# Patient Record
Sex: Male | Born: 1990 | Race: White | Hispanic: No | Marital: Single | Smoking: Current every day smoker
Health system: Southern US, Community
[De-identification: ages and names within clinical notes are randomized; demographics above are authoritative.]

## PROBLEM LIST (undated history)

## (undated) DIAGNOSIS — I10 Essential (primary) hypertension: Secondary | ICD-10-CM

---

## 2020-07-14 DIAGNOSIS — R109 Unspecified abdominal pain: Secondary | ICD-10-CM | POA: Insufficient documentation

## 2020-07-14 DIAGNOSIS — R1110 Vomiting, unspecified: Secondary | ICD-10-CM | POA: Insufficient documentation

## 2020-07-14 DIAGNOSIS — Z5321 Procedure and treatment not carried out due to patient leaving prior to being seen by health care provider: Secondary | ICD-10-CM | POA: Insufficient documentation

## 2020-07-15 ENCOUNTER — Encounter: Payer: Self-pay | Admitting: Emergency Medicine

## 2020-07-15 ENCOUNTER — Inpatient Hospital Stay
Admission: EM | Admit: 2020-07-15 | Discharge: 2020-07-19 | DRG: 638 | Disposition: A | Discharge status: Home / Self Care | Payer: Self-pay | Attending: Internal Medicine | Admitting: Internal Medicine

## 2020-07-15 ENCOUNTER — Other Ambulatory Visit: Payer: Self-pay

## 2020-07-15 ENCOUNTER — Emergency Department: Payer: Self-pay

## 2020-07-15 ENCOUNTER — Emergency Department
Admission: EM | Admit: 2020-07-15 | Discharge: 2020-07-15 | Disposition: A | Discharge status: Home / Self Care | Payer: Self-pay | Attending: Emergency Medicine | Admitting: Emergency Medicine

## 2020-07-15 DIAGNOSIS — Z20822 Contact with and (suspected) exposure to covid-19: Secondary | ICD-10-CM | POA: Diagnosis present

## 2020-07-15 DIAGNOSIS — N179 Acute kidney failure, unspecified: Secondary | ICD-10-CM | POA: Diagnosis present

## 2020-07-15 DIAGNOSIS — R030 Elevated blood-pressure reading, without diagnosis of hypertension: Secondary | ICD-10-CM | POA: Diagnosis present

## 2020-07-15 DIAGNOSIS — Z833 Family history of diabetes mellitus: Secondary | ICD-10-CM

## 2020-07-15 DIAGNOSIS — E861 Hypovolemia: Secondary | ICD-10-CM | POA: Diagnosis present

## 2020-07-15 DIAGNOSIS — R748 Abnormal levels of other serum enzymes: Secondary | ICD-10-CM | POA: Diagnosis present

## 2020-07-15 DIAGNOSIS — Z6841 Body Mass Index (BMI) 40.0 and over, adult: Secondary | ICD-10-CM

## 2020-07-15 DIAGNOSIS — E1110 Type 2 diabetes mellitus with ketoacidosis without coma: Principal | ICD-10-CM | POA: Diagnosis present

## 2020-07-15 DIAGNOSIS — E876 Hypokalemia: Secondary | ICD-10-CM | POA: Diagnosis present

## 2020-07-15 DIAGNOSIS — E1310 Other specified diabetes mellitus with ketoacidosis without coma: Secondary | ICD-10-CM

## 2020-07-15 DIAGNOSIS — F17210 Nicotine dependence, cigarettes, uncomplicated: Secondary | ICD-10-CM | POA: Diagnosis present

## 2020-07-15 DIAGNOSIS — E872 Acidosis, unspecified: Secondary | ICD-10-CM

## 2020-07-15 DIAGNOSIS — E860 Dehydration: Secondary | ICD-10-CM | POA: Diagnosis present

## 2020-07-15 DIAGNOSIS — D649 Anemia, unspecified: Secondary | ICD-10-CM | POA: Diagnosis present

## 2020-07-15 DIAGNOSIS — E119 Type 2 diabetes mellitus without complications: Secondary | ICD-10-CM

## 2020-07-15 HISTORY — DX: Essential (primary) hypertension: I10

## 2020-07-15 LAB — BLOOD GAS, VENOUS
Acid-base deficit: 24.8 mmol/L — ABNORMAL HIGH (ref 0.0–2.0)
Bicarbonate: 6.3 mmol/L — ABNORMAL LOW (ref 20.0–28.0)
O2 Saturation: 41.7 %
Patient temperature: 37
pCO2, Ven: 28 mmHg — ABNORMAL LOW (ref 44.0–60.0)
pH, Ven: 6.96 — CL (ref 7.250–7.430)
pO2, Ven: 40 mmHg (ref 32.0–45.0)

## 2020-07-15 LAB — COMPREHENSIVE METABOLIC PANEL
ALT: 21 U/L (ref 0–44)
ALT: 19 U/L (ref 0–44)
AST: 22 U/L (ref 15–41)
AST: 32 U/L (ref 15–41)
Albumin: 5.3 g/dL — ABNORMAL HIGH (ref 3.5–5.0)
Albumin: 5.2 g/dL — ABNORMAL HIGH (ref 3.5–5.0)
Alkaline Phosphatase: 62 U/L (ref 38–126)
Alkaline Phosphatase: 67 U/L (ref 38–126)
BUN: 20 mg/dL (ref 6–20)
BUN: 31 mg/dL — ABNORMAL HIGH (ref 6–20)
CO2: <7 mmol/L — ABNORMAL LOW (ref 22–32)
CO2: <7 mmol/L — ABNORMAL LOW (ref 22–32)
Calcium: 9.7 mg/dL (ref 8.9–10.3)
Calcium: 9.7 mg/dL (ref 8.9–10.3)
Chloride: 94 mmol/L — ABNORMAL LOW (ref 98–111)
Chloride: 90 mmol/L — ABNORMAL LOW (ref 98–111)
Creatinine, Ser: 1.97 mg/dL — ABNORMAL HIGH (ref 0.61–1.24)
Creatinine, Ser: 2.95 mg/dL — ABNORMAL HIGH (ref 0.61–1.24)
GFR, Estimated: 46 mL/min — ABNORMAL LOW (ref >60)
GFR, Estimated: 29 mL/min — ABNORMAL LOW (ref >60)
Glucose, Bld: 494 mg/dL — ABNORMAL HIGH (ref 70–99)
Glucose, Bld: 793 mg/dL (ref 70–99)
Potassium: 5.4 mmol/L — ABNORMAL HIGH (ref 3.5–5.1)
Potassium: 5.7 mmol/L — ABNORMAL HIGH (ref 3.5–5.1)
Sodium: 129 mmol/L — ABNORMAL LOW (ref 135–145)
Sodium: 131 mmol/L — ABNORMAL LOW (ref 135–145)
Total Bilirubin: 1.9 mg/dL — ABNORMAL HIGH (ref 0.3–1.2)
Total Bilirubin: 1.9 mg/dL — ABNORMAL HIGH (ref 0.3–1.2)
Total Protein: 9.4 g/dL — ABNORMAL HIGH (ref 6.5–8.1)
Total Protein: 9.3 g/dL — ABNORMAL HIGH (ref 6.5–8.1)

## 2020-07-15 LAB — CBC
HCT: 52 % (ref 39.0–52.0)
HCT: 52.2 % — ABNORMAL HIGH (ref 39.0–52.0)
Hemoglobin: 17.5 g/dL — ABNORMAL HIGH (ref 13.0–17.0)
Hemoglobin: 17.3 g/dL — ABNORMAL HIGH (ref 13.0–17.0)
MCH: 31.3 pg (ref 26.0–34.0)
MCH: 31.3 pg (ref 26.0–34.0)
MCHC: 33.7 g/dL (ref 30.0–36.0)
MCHC: 33.1 g/dL (ref 30.0–36.0)
MCV: 92.9 fL (ref 80.0–100.0)
MCV: 94.4 fL (ref 80.0–100.0)
Platelets: 261 10*3/uL (ref 150–400)
Platelets: 262 10*3/uL (ref 150–400)
RBC: 5.6 MIL/uL (ref 4.22–5.81)
RBC: 5.53 MIL/uL (ref 4.22–5.81)
RDW: 12.3 % (ref 11.5–15.5)
RDW: 12.6 % (ref 11.5–15.5)
WBC: 10.6 10*3/uL — ABNORMAL HIGH (ref 4.0–10.5)
WBC: 23.3 10*3/uL — ABNORMAL HIGH (ref 4.0–10.5)
nRBC: 0 % (ref 0.0–0.2)
nRBC: 0 % (ref 0.0–0.2)

## 2020-07-15 LAB — BASIC METABOLIC PANEL
Anion gap: 29 — ABNORMAL HIGH (ref 5–15)
Anion gap: 21 — ABNORMAL HIGH (ref 5–15)
BUN: 29 mg/dL — ABNORMAL HIGH (ref 6–20)
BUN: 22 mg/dL — ABNORMAL HIGH (ref 6–20)
CO2: 7 mmol/L — ABNORMAL LOW (ref 22–32)
CO2: 13 mmol/L — ABNORMAL LOW (ref 22–32)
Calcium: 9 mg/dL (ref 8.9–10.3)
Calcium: 9 mg/dL (ref 8.9–10.3)
Chloride: 99 mmol/L (ref 98–111)
Chloride: 100 mmol/L (ref 98–111)
Creatinine, Ser: 2.12 mg/dL — ABNORMAL HIGH (ref 0.61–1.24)
Creatinine, Ser: 1.41 mg/dL — ABNORMAL HIGH (ref 0.61–1.24)
GFR, Estimated: 42 mL/min — ABNORMAL LOW (ref >60)
GFR, Estimated: >60 mL/min (ref >60)
Glucose, Bld: 390 mg/dL — ABNORMAL HIGH (ref 70–99)
Glucose, Bld: 227 mg/dL — ABNORMAL HIGH (ref 70–99)
Potassium: 4.5 mmol/L (ref 3.5–5.1)
Potassium: 3.6 mmol/L (ref 3.5–5.1)
Sodium: 135 mmol/L (ref 135–145)
Sodium: 134 mmol/L — ABNORMAL LOW (ref 135–145)

## 2020-07-15 LAB — URINALYSIS, ROUTINE W REFLEX MICROSCOPIC
Bilirubin Urine: NEGATIVE
Glucose, UA: >=500 mg/dL — AB
Ketones, ur: 80 mg/dL — AB
Leukocytes,Ua: NEGATIVE
Nitrite: NEGATIVE
Protein, ur: 30 mg/dL — AB
Specific Gravity, Urine: 1.022 (ref 1.005–1.030)
pH: 5 (ref 5.0–8.0)

## 2020-07-15 LAB — URINALYSIS, COMPLETE (UACMP) WITH MICROSCOPIC
Bacteria, UA: NONE SEEN
Bilirubin Urine: NEGATIVE
Glucose, UA: >=500 mg/dL — AB
Ketones, ur: 80 mg/dL — AB
Leukocytes,Ua: NEGATIVE
Nitrite: NEGATIVE
Protein, ur: 100 mg/dL — AB
Specific Gravity, Urine: 1.025 (ref 1.005–1.030)
pH: 5 (ref 5.0–8.0)

## 2020-07-15 LAB — CBG MONITORING, ED
Glucose-Capillary: 584 mg/dL (ref 70–99)
Glucose-Capillary: >600 mg/dL (ref 70–99)
Glucose-Capillary: 468 mg/dL — ABNORMAL HIGH (ref 70–99)
Glucose-Capillary: 557 mg/dL (ref 70–99)
Glucose-Capillary: 401 mg/dL — ABNORMAL HIGH (ref 70–99)
Glucose-Capillary: 312 mg/dL — ABNORMAL HIGH (ref 70–99)
Glucose-Capillary: 223 mg/dL — ABNORMAL HIGH (ref 70–99)
Glucose-Capillary: 214 mg/dL — ABNORMAL HIGH (ref 70–99)

## 2020-07-15 LAB — BETA-HYDROXYBUTYRIC ACID
Beta-Hydroxybutyric Acid: >8 mmol/L — ABNORMAL HIGH (ref 0.05–0.27)
Beta-Hydroxybutyric Acid: >8 mmol/L — ABNORMAL HIGH (ref 0.05–0.27)
Beta-Hydroxybutyric Acid: 6.85 mmol/L — ABNORMAL HIGH (ref 0.05–0.27)

## 2020-07-15 LAB — MRSA PCR SCREENING: MRSA by PCR: NEGATIVE

## 2020-07-15 LAB — TROPONIN I (HIGH SENSITIVITY): Troponin I (High Sensitivity): 12 ng/L (ref <18)

## 2020-07-15 LAB — LIPASE, BLOOD
Lipase: 42 U/L (ref 11–51)
Lipase: 386 U/L — ABNORMAL HIGH (ref 11–51)

## 2020-07-15 LAB — SARS CORONAVIRUS 2 BY RT PCR (HOSPITAL ORDER, PERFORMED IN ~~LOC~~ HOSPITAL LAB): SARS Coronavirus 2: NEGATIVE

## 2020-07-15 LAB — TRIGLYCERIDES: Triglycerides: 180 mg/dL — ABNORMAL HIGH (ref <150)

## 2020-07-15 LAB — GLUCOSE, CAPILLARY
Glucose-Capillary: 219 mg/dL — ABNORMAL HIGH (ref 70–99)
Glucose-Capillary: 206 mg/dL — ABNORMAL HIGH (ref 70–99)
Glucose-Capillary: 217 mg/dL — ABNORMAL HIGH (ref 70–99)

## 2020-07-15 LAB — PROCALCITONIN: Procalcitonin: 2.67 ng/mL

## 2020-07-15 MED ORDER — DEXTROSE IN LACTATED RINGERS 5 % IV SOLN: INTRAVENOUS | Status: DC

## 2020-07-15 MED ORDER — DEXTROSE 50 % IV SOLN: 0.0000 mL | INTRAVENOUS | Status: DC | PRN

## 2020-07-15 MED ORDER — SODIUM CHLORIDE 0.9% FLUSH: 10.0000 mL | INTRAVENOUS | Status: DC | PRN

## 2020-07-15 MED ORDER — SODIUM CHLORIDE 0.9% FLUSH
10.0000 mL | Freq: Two times a day (BID) | INTRAVENOUS | Status: DC
  Administered 2020-07-15 – 2020-07-18 (×6): 10 mL

## 2020-07-15 MED ORDER — LACTATED RINGERS IV SOLN: INTRAVENOUS | Status: DC

## 2020-07-15 MED ORDER — ONDANSETRON HCL 4 MG/2ML IJ SOLN: 4.0000 mg | Freq: Four times a day (QID) | INTRAMUSCULAR | Status: DC | PRN

## 2020-07-15 MED ORDER — CHLORHEXIDINE GLUCONATE CLOTH 2 % EX PADS
6.0000 | MEDICATED_PAD | Freq: Every day | CUTANEOUS | Status: DC
  Administered 2020-07-16: 6 via TOPICAL

## 2020-07-15 MED ORDER — INSULIN REGULAR(HUMAN) IN NACL 100-0.9 UT/100ML-% IV SOLN
INTRAVENOUS | Status: DC
  Administered 2020-07-15: 5 [IU]/h via INTRAVENOUS
  Administered 2020-07-15: 11.5 [IU]/h via INTRAVENOUS
  Administered 2020-07-16: 4.4 [IU]/h via INTRAVENOUS
  Filled 2020-07-15 (×3): qty 100

## 2020-07-15 MED ORDER — ONDANSETRON HCL 4 MG/2ML IJ SOLN
4.0000 mg | Freq: Once | INTRAMUSCULAR | Status: AC
  Administered 2020-07-15: 4 mg via INTRAVENOUS
  Filled 2020-07-15: qty 2

## 2020-07-15 MED ORDER — POLYETHYLENE GLYCOL 3350 17 G PO PACK
17.0000 g | PACK | Freq: Every day | ORAL | Status: DC | PRN
Start: 2020-07-15 — End: 2020-07-19

## 2020-07-15 MED ORDER — LACTATED RINGERS IV BOLUS
2000.0000 mL | Freq: Once | INTRAVENOUS | Status: AC
  Administered 2020-07-15: 2000 mL via INTRAVENOUS

## 2020-07-15 MED ORDER — PANTOPRAZOLE SODIUM 40 MG IV SOLR
40.0000 mg | Freq: Every day | INTRAVENOUS | Status: DC
  Administered 2020-07-15 – 2020-07-17 (×3): 40 mg via INTRAVENOUS
  Filled 2020-07-15 (×3): qty 40

## 2020-07-15 MED ORDER — ONDANSETRON 4 MG PO TBDP
4.0000 mg | ORAL_TABLET | Freq: Once | ORAL | Status: AC
  Administered 2020-07-15: 4 mg via ORAL
  Filled 2020-07-15: qty 1

## 2020-07-15 MED ORDER — LACTATED RINGERS IV BOLUS
1000.0000 mL | Freq: Once | INTRAVENOUS | Status: AC
  Administered 2020-07-15: 1000 mL via INTRAVENOUS

## 2020-07-15 MED ORDER — STERILE WATER FOR INJECTION IV SOLN
INTRAVENOUS | Status: DC
  Filled 2020-07-15 (×2): qty 850
  Filled 2020-07-15: qty 150
  Filled 2020-07-15 (×3): qty 850
  Filled 2020-07-15: qty 150

## 2020-07-15 MED ORDER — POTASSIUM CHLORIDE 10 MEQ/100ML IV SOLN
10.0000 meq | INTRAVENOUS | Status: AC
  Administered 2020-07-15 – 2020-07-16 (×4): 10 meq via INTRAVENOUS
  Filled 2020-07-15 (×4): qty 100

## 2020-07-15 MED ORDER — SODIUM BICARBONATE 8.4 % IV SOLN
100.0000 meq | Freq: Once | INTRAVENOUS | Status: AC
  Administered 2020-07-15: 100 meq via INTRAVENOUS
  Filled 2020-07-15: qty 100

## 2020-07-15 MED ORDER — INSULIN ASPART 100 UNIT/ML ~~LOC~~ SOLN
10.0000 [IU] | Freq: Once | SUBCUTANEOUS | Status: AC
  Administered 2020-07-15: 10 [IU] via INTRAVENOUS

## 2020-07-15 MED ORDER — HEPARIN SODIUM (PORCINE) 5000 UNIT/ML IJ SOLN
5000.0000 [IU] | Freq: Three times a day (TID) | INTRAMUSCULAR | Status: DC
  Administered 2020-07-15 – 2020-07-18 (×11): 5000 [IU] via SUBCUTANEOUS
  Filled 2020-07-15 (×12): qty 1

## 2020-07-15 MED ORDER — DOCUSATE SODIUM 100 MG PO CAPS: 100.0000 mg | ORAL_CAPSULE | Freq: Two times a day (BID) | ORAL | Status: DC | PRN

## 2020-07-15 NOTE — Progress Notes (Signed)
elink new pt eval:  New onset DkA Glucose, beta & AG decreasing with insulin gtt Renal function improving K3.6 has been repleted  Clayton Boyd V. Vassie Loll MD

## 2020-07-15 NOTE — ED Triage Notes (Signed)
Pt in with co vomiting and abd pain for over a week. States has hx of hernia but no hx of persistent vomiting.

## 2020-07-15 NOTE — ED Provider Notes (Signed)
[facility] Emergency Department Provider Note ____________________________________________   Event Date/Time   First MD Initiated Contact with Patient 07/15/20 [phone]     (approximate)  I have reviewed the triage vital signs and the nursing notes.  HISTORY  Chief Complaint Abdominal Pain   HPI Clayton Boyd is a 30 y.o. malewho presents to the ED for evaluation of abdominal pain and emesis.  Chart review indicates history of obesity and hypertension not on prescription medication. Patient reports a family history of diabetes, but no personal history of such.  Patient reports about 2 weeks of progressively worsening polyuria, polydipsia, abdominal discomfort and intolerance of food due to nausea.  He reports constant, generalized abdominal discomfort that is aching in nature and 6/10 intensity.  Past Medical History:  Diagnosis Date  . Hypertension     Patient Active Problem List   Diagnosis Date Noted  . Diabetic ketoacidosis (HCC) 07/15/2020    History reviewed. No pertinent surgical history.  Prior to Admission medications   Not on File    Allergies Patient has no known allergies.  No family history on file.  Social History Social History   Tobacco Use  . Smoking status: Current Every Day Smoker    Types: Cigarettes  . Smokeless tobacco: Never Used    Review of Systems  Constitutional: No fever/chills Eyes: No visual changes. ENT: No sore throat. Cardiovascular: Denies chest pain. Respiratory: Denies shortness of breath. Gastrointestinal: Positive for abdominal pain and nausea, . No diarrhea.  No constipation. Genitourinary: Negative for dysuria. Musculoskeletal: Negative for back pain. Skin: Negative for rash. Neurological: Negative for headaches, focal weakness or numbness.  ____________________________________________   PHYSICAL EXAM:  VITAL SIGNS: Vitals:   07/15/20 1409 07/15/20 1430  BP: (!) 122/57 (!)  138/98  Pulse: (!) 116 (!) 109  Resp: (!) 39 (!) 29  Temp:    SpO2: 99% 98%     Constitutional: Alert and oriented.  Appears acutely ill and toxic.  Kussmaul respirations noted.  Follows commands in all 4 extremities.  Repeatedly requesting ice and water. Eyes: Conjunctivae are normal. PERRL. EOMI. Head: Atraumatic. Nose: No congestion/rhinnorhea. Mouth/Throat: Mucous membranes are dry.  Oropharynx non-erythematous. Neck: No stridor. No cervical spine tenderness to palpation. Cardiovascular: Tachycardic rate, regular rhythm. Grossly normal heart sounds.  Good peripheral circulation. Respiratory: Normal respiratory effort.  No retractions. Lungs CTAB. Gastrointestinal: Soft , nondistended. No CVA tenderness.  Mild and diffuse tenderness without peritoneal no localizing features. Musculoskeletal: No lower extremity tenderness nor edema.  No joint effusions. No signs of acute trauma. Neurologic:  Normal speech and language. No gross focal neurologic deficits are appreciated. Skin:  Skin is warm, dry and intact. No rash noted. Psychiatric: Mood and affect are normal. Speech and behavior are normal.  ____________________________________________   LABS (all labs ordered are listed, but only abnormal results are displayed)  Labs Reviewed  LIPASE, BLOOD - Abnormal; Notable for the following components:      Result Value   Lipase 386 (*)    All other components within normal limits  COMPREHENSIVE METABOLIC PANEL - Abnormal; Notable for the following components:   Sodium 131 (*)    Potassium 5.7 (*)    Chloride 90 (*)    CO2 <7 (*)    Glucose, Bld 793 (*)    BUN 31 (*)    Creatinine, Ser 2.95 (*)    Total Protein 9.3 (*)    Albumin 5.2 (*)    Total Bilirubin 1.9 (*)  GFR, Estimated 29 (*)    All other components within normal limits  CBC - Abnormal; Notable for the following components:   WBC 23.3 (*)    Hemoglobin 17.3 (*)    HCT 52.2 (*)    All other components within normal  limits  BETA-HYDROXYBUTYRIC ACID - Abnormal; Notable for the following components:   Beta-Hydroxybutyric Acid >8.00 (*)    All other components within normal limits  BLOOD GAS, VENOUS - Abnormal; Notable for the following components:   pH, Ven 6.96 (*)    pCO2, Ven 28 (*)    Bicarbonate 6.3 (*)    Acid-base deficit 24.8 (*)    All other components within normal limits  URINALYSIS, ROUTINE W REFLEX MICROSCOPIC - Abnormal; Notable for the following components:   Color, Urine STRAW (*)    APPearance HAZY (*)    Glucose, UA >=500 (*)    Hgb urine dipstick MODERATE (*)    Ketones, ur 80 (*)    Protein, ur 30 (*)    Bacteria, UA RARE (*)    All other components within normal limits  CBG MONITORING, ED - Abnormal; Notable for the following components:   Glucose-Capillary 584 (*)    All other components within normal limits  CBG MONITORING, ED - Abnormal; Notable for the following components:   Glucose-Capillary >600 (*)    All other components within normal limits  CBG MONITORING, ED - Abnormal; Notable for the following components:   Glucose-Capillary 557 (*)    All other components within normal limits  CBG MONITORING, ED - Abnormal; Notable for the following components:   Glucose-Capillary 468 (*)    All other components within normal limits  CBG MONITORING, ED - Abnormal; Notable for the following components:   Glucose-Capillary 401 (*)    All other components within normal limits  SARS CORONAVIRUS 2 BY RT PCR (HOSPITAL ORDER, PERFORMED IN [facility])  CULTURE, BLOOD (ROUTINE X 2)  CULTURE, BLOOD (ROUTINE X 2)  MRSA PCR SCREENING  BASIC METABOLIC PANEL  BASIC METABOLIC PANEL  BASIC METABOLIC PANEL  BETA-HYDROXYBUTYRIC ACID  DRUG SCREEN 379024 11+OXYCO+ALC+CRT-BUND  HIV ANTIBODY (ROUTINE TESTING W REFLEX)  HEMOGLOBIN A1C  BETA-HYDROXYBUTYRIC ACID  BASIC METABOLIC PANEL  PROCALCITONIN  TRIGLYCERIDES  TROPONIN I (HIGH SENSITIVITY)    ____________________________________________  12 Lead EKG  Sinus rhythm, rate of 130 bpm.  Rightward axis.  Incomplete right bundle and otherwise normal intervals.  Stigmata of LVH.  No evidence of STEMI or acute ischemia. ____________________________________________  RADIOLOGY  ED MD interpretation: 1 view CXR viewed by me with no evidence of acute cardiopulmonary pathology.  Official radiology report(s): DG Chest Portable 1 View  Result Date: 07/15/2020 CLINICAL DATA:  Shortness of breath. EXAM: PORTABLE CHEST 1 VIEW COMPARISON:  None. FINDINGS: The heart size and mediastinal contours are within normal limits. Both lungs are clear. No pneumothorax or pleural effusion is noted. The visualized skeletal structures are unremarkable. IMPRESSION: No active disease. Electronically Signed   By: Lupita Raider M.D.   On: 07/15/2020 10:19    ____________________________________________   PROCEDURES and INTERVENTIONS  Procedure(s) performed (including Critical Care):  .1-3 Lead EKG Interpretation Performed by: Delton Prairie, MD Authorized by: Delton Prairie, MD     Interpretation: abnormal     ECG rate:  130   ECG rate assessment: tachycardic     Rhythm: sinus tachycardia     Ectopy: none     Conduction: normal   .Critical Care Performed by: Katrinka Blazing,  Domingo Dimes, MD Authorized by: Delton Prairie, MD   Critical care provider statement:    Critical care time (minutes):  45   Critical care was necessary to treat or prevent imminent or life-threatening deterioration of the following conditions:  Metabolic crisis   Critical care was time spent personally by me on the following activities:  Discussions with consultants, evaluation of patient's response to treatment, examination of patient, ordering and performing treatments and interventions, ordering and review of laboratory studies, ordering and review of radiographic studies, pulse oximetry, re-evaluation of patient's condition, obtaining  history from patient or surrogate and review of old charts    Medications  sodium bicarbonate 150 mEq in sterile water 1,000 mL infusion ( Intravenous New Bag/Given 07/15/20 1215)  insulin regular, human (MYXREDLIN) 100 units/ 100 mL infusion (11.5 Units/hr Intravenous Rate/Dose Change 07/15/20 1425)  dextrose 5 % in lactated ringers infusion (0 mLs Intravenous Hold 07/15/20 1210)  dextrose 50 % solution 0-50 mL (has no administration in time range)  docusate sodium (COLACE) capsule 100 mg (has no administration in time range)  polyethylene glycol (MIRALAX / GLYCOLAX) packet 17 g (has no administration in time range)  heparin injection 5,000 Units (5,000 Units Subcutaneous Given 07/15/20 1344)  ondansetron (ZOFRAN) injection 4 mg (has no administration in time range)  pantoprazole (PROTONIX) injection 40 mg (has no administration in time range)  lactated ringers bolus 2,000 mL (0 mLs Intravenous Stopped 07/15/20 1154)  ondansetron (ZOFRAN) injection 4 mg (4 mg Intravenous Given 07/15/20 1009)  sodium bicarbonate injection 100 mEq (100 mEq Intravenous Given 07/15/20 1155)  insulin aspart (novoLOG) injection 10 Units (10 Units Intravenous Given 07/15/20 1226)  lactated ringers bolus 1,000 mL (1,000 mLs Intravenous New Bag/Given 07/15/20 1424)    ____________________________________________   MDM / ED COURSE   Obese 30 year old male presents to the ED with evidence of DKA and new onset diabetes requiring ICU admission.  Persistently tachycardic in a sinus tachycardia, but hemodynamically stable.  Exam with kussmaul respirations, but no evidence of neurovascular deficits or trauma.  Mild abdominal tenderness without localizing or peritoneal features.  CBG reading "high."  Metabolic panel and beta hydroxybutyrate with evidence of new onset DKA.  Venous blood gas with significant acidosis with a pH of 6.9.  Provided patient 3 L fluid bolus, bicarb bolus and infusion, and will start insulin infusion per  DKA protocol.  Due to severity of his acidosis we will admit the patient to ICU for further work-up and management.  Clinical Course as of 07/15/20 1522  Thu Jul 15, 2020  1130 pH noted. Bicarb bolus and infusion ordered. ICU paged. Insulin and DKA orders placed [DS]  1209 Dr. Francine Graven in the ED. We discuss patient workup and presentation. He accepts for admit [DS]    Clinical Course User Index [DS] Delton Prairie, MD    ____________________________________________   FINAL CLINICAL IMPRESSION(S) / ED DIAGNOSES  Final diagnoses:  Diabetic ketoacidosis without coma associated with type 2 diabetes mellitus (HCC)  Dehydration  Acidosis     ED Discharge Orders    None       Franchot Pollitt   Note:  This document was prepared using Dragon voice recognition software and may include unintentional dictation errors.   Delton Prairie, MD 07/15/20 [phone]

## 2020-07-15 NOTE — ED Triage Notes (Signed)
Arrives via EMS:  C/O increased thirst x several weeks.  No food x 1 week.  Does have history of diabetes in family.  CBG:  287 by EMS

## 2020-07-15 NOTE — ED Notes (Signed)
No answer when called several times from lobby 

## 2020-07-15 NOTE — Progress Notes (Incomplete)
PHARMACY CONSULT NOTE - FOLLOW UP  Pharmacy Consult for Electrolyte Monitoring and Replacement   Recent Labs: Potassium (mmol/L)  Date Value  07/15/2020 5.7 (H)   Calcium (mg/dL)  Date Value  38/45/3646 9.7   Albumin (g/dL)  Date Value  80/32/1224 5.2 (H)   Sodium (mmol/L)  Date Value  07/15/2020 131 (L)     Assessment: ***  Goal of Therapy:  ***  Plan:  ***  Derrek Gu ,PharmD Clinical Pharmacist 07/15/2020 12:43 PM

## 2020-07-15 NOTE — ED Notes (Signed)
Lab at bedside

## 2020-07-15 NOTE — Plan of Care (Signed)
°  Problem: Education: °Goal: Knowledge of General Education information will improve °Description: Including pain rating scale, medication(s)/side effects and non-pharmacologic comfort measures °Outcome: Progressing °  °Problem: Clinical Measurements: °Goal: Will remain free from infection °Outcome: Progressing °Goal: Diagnostic test results will improve °Outcome: Progressing °  °Problem: Coping: °Goal: Level of anxiety will decrease °Outcome: Progressing °  °Problem: Pain Managment: °Goal: General experience of comfort will improve °Outcome: Progressing °  °

## 2020-07-15 NOTE — Progress Notes (Signed)
R cephalic midline inserted per MD order. Instructed pt's RN that ML not guaranteed to give blood return. Also showed RN a list of meds that should not be infused into the midline. One being Sodium Bicarb which is on pt's MAR.

## 2020-07-15 NOTE — ED Notes (Addendum)
Patient sleeping at this time. Family at bedside.

## 2020-07-15 NOTE — ED Notes (Signed)
Lab and Francine Graven, MD at bedside.

## 2020-07-15 NOTE — ED Notes (Signed)
MD at bedside. 

## 2020-07-15 NOTE — Progress Notes (Signed)
Patient stated that he has fallen 2 times in the past 7 days. Patient stated that it felt like his legs and feet went numb. Patient girlfriend Judeth Cornfield stated that the patient has actually fallen 3 times recently. Patient too drowsy to continue with admission questions.

## 2020-07-15 NOTE — ED Notes (Signed)
IV team at bedside 

## 2020-07-15 NOTE — H&P (Signed)
NAME:  Clayton Boyd, MRN:  [id number], DOB:  07-29-1990, LOS: 0 ADMISSION DATE:  07/15/2020, CONSULTATION DATE:  07/15/20 REFERRING MD: Delton Prairie   CHIEF COMPLAINT: nausea, vomiting abdominal pain  Brief History:  Clayton Boyd is a 30 year old male, daily smoker with obesity who presents with two weeks of nausea, vomiting and recent onset abdominal pain and generalized weakness.   He was found to be in diabetic ketoacidosis with new diagnosis of diabetes.   History of Present Illness:  30 year old male presented to [facility] on 07/15/20 with two weeks of progressive symptoms which started with nausea and vomiting then abdominal pain and generalized weakness. He was not able to keep much food or drink down over recent days. He was found down, conscious, on the ground by his girlfriend overnight and then he was brought into the ER.   VBG shows pH 6.96, blood sugar was 494-793 on arrival and beta hydroxybutyrate >8.   He denies fevers, chills or sweats. He currently has abdominal pain. He denies diarrhea. No chest pain, lower extremity edema or swelling. He smokes cigarettes and marijuana, Denies other drug use.   PCCM was called to admit the patient for diabetic ketoacidosis   Past Medical History:  Obesity  Significant Hospital Events:    Consults:  PCCM  Procedures:    Significant Diagnostic Tests:    Micro Data:  SARS-CoV-2 Negative 1/20   Antimicrobials:    Interim History / Subjective:    Objective   Blood pressure (!) 146/95, pulse (!) 116, temperature 98.2 F (36.8 C), temperature source Oral, resp. rate (!) 32, SpO2 100 %.        Intake/Output Summary (Last 24 hours) at 07/15/2020 1238 Last data filed at 07/15/2020 1154 Gross per 24 hour  Intake 1764.9 ml  Output -  Net 1764.9 ml   There were no vitals filed for this visit.  Examination: General: young male, moderate distress HENT: [place]/AT, sclera anicteric, dry mucous membranes Lungs: clear to  auscultation bilaterally Cardiovascular: tachycardic, s1s2, no murmurs Abdomen: soft, non-distended, bowel sounds present, non-tender Extremities: no edema, wamr Neuro: alert, oriented, PERRL, moving all extremities GU: No foley present  Resolved Hospital Problem list     Assessment & Plan:   Diabetic Ketoacidosis In setting of new diagnosis of diabetes and possible pancreatitis - Continue insulin drip - Bolus with lactated ringers as needed - Currently on bicarb drip per ED - BMP q4hrs - Check Utox  - Check hemoglobin a1c - Check blood cultures, procalcitonin. Chest radiograph not indicative of pneumonia and UA does not appear infected.  Acute Kidney Injury - Pre-renal in etiology due to hypovolemia - Continue management as above - Will check renal US or abdominal CT - Urinalysis unremarkable   ?Pancreatitis - Possible pancreatitis given elevated lipase and abdominal pain - Will check CT abdomen once patient is more stable, possibly with contrast based on renal function - Continue aggressive fluid resuscitation as above for DKA - Check triglycerides  Best practice (evaluated daily)  Diet: NPO Pain/Anxiety/Delirium protocol (if indicated): n/a VAP protocol (if indicated): n/a DVT prophylaxis: heparin GI prophylaxis: pantoprazole Glucose control: Insulin gtt Mobility: bed rest Disposition:ICU Code Status: Full  Labs   CBC: Recent Labs  Lab 07/15/20 0047 07/15/20 0927  WBC 10.6* 23.3*  HGB 17.5* 17.3*  HCT 52.0 52.2*  MCV 92.9 94.4  PLT 261 262    Basic Metabolic Panel: Recent Labs  Lab 07/15/20 0047 07/15/20 0927  NA 129*  131*  K 5.4* 5.7*  CL 94* 90*  CO2 <7* <7*  GLUCOSE 494* 793*  BUN 20 31*  CREATININE 1.97* 2.95*  CALCIUM 9.7 9.7   GFR: Estimated Creatinine Clearance: 50.4 mL/min (A) (by C-G formula based on SCr of 2.95 mg/dL (H)). Recent Labs  Lab 07/15/20 0047 07/15/20 0927  WBC 10.6* 23.3*    Liver Function Tests: Recent Labs   Lab 07/15/20 0047 07/15/20 0927  AST 22 32  ALT 21 19  ALKPHOS 62 67  BILITOT 1.9* 1.9*  PROT 9.4* 9.3*  ALBUMIN 5.3* 5.2*   Recent Labs  Lab 07/15/20 0047 07/15/20 0927  LIPASE 42 386*   No results for input(s): AMMONIA in the last 168 hours.  ABG    Component Value Date/Time   HCO3 6.3 (L) 07/15/2020 0954   ACIDBASEDEF 24.8 (H) 07/15/2020 0954   O2SAT 41.7 07/15/2020 0954     Coagulation Profile: No results for input(s): INR, PROTIME in the last 168 hours.  Cardiac Enzymes: No results for input(s): CKTOTAL, CKMB, CKMBINDEX, TROPONINI in the last 168 hours.  HbA1C: No results found for: HGBA1C  CBG: Recent Labs  Lab 07/15/20 1216  GLUCAP 584*    Review of Systems:   A 10 point review of systems was performed and is negative unless stated above.  Past Medical History:  He,  has a past medical history of Hypertension.   Surgical History:  History reviewed. No pertinent surgical history.   Social History:   reports that he has been smoking cigarettes. He has never used smokeless tobacco.   Family History:  His family history is not on file.   Allergies No Known Allergies   Home Medications  Prior to Admission medications   Not on File     Critical care time: 60 minutes    Melody Comas, MD [facility] Office: [phone]   See Amion for Pager Details

## 2020-07-16 ENCOUNTER — Inpatient Hospital Stay: Payer: Self-pay

## 2020-07-16 DIAGNOSIS — N179 Acute kidney failure, unspecified: Secondary | ICD-10-CM | POA: Diagnosis present

## 2020-07-16 LAB — GLUCOSE, CAPILLARY
Glucose-Capillary: 149 mg/dL — ABNORMAL HIGH (ref 70–99)
Glucose-Capillary: 162 mg/dL — ABNORMAL HIGH (ref 70–99)
Glucose-Capillary: 165 mg/dL — ABNORMAL HIGH (ref 70–99)
Glucose-Capillary: 167 mg/dL — ABNORMAL HIGH (ref 70–99)
Glucose-Capillary: 168 mg/dL — ABNORMAL HIGH (ref 70–99)
Glucose-Capillary: 170 mg/dL — ABNORMAL HIGH (ref 70–99)
Glucose-Capillary: 170 mg/dL — ABNORMAL HIGH (ref 70–99)
Glucose-Capillary: 171 mg/dL — ABNORMAL HIGH (ref 70–99)
Glucose-Capillary: 175 mg/dL — ABNORMAL HIGH (ref 70–99)
Glucose-Capillary: 181 mg/dL — ABNORMAL HIGH (ref 70–99)
Glucose-Capillary: 186 mg/dL — ABNORMAL HIGH (ref 70–99)
Glucose-Capillary: 187 mg/dL — ABNORMAL HIGH (ref 70–99)
Glucose-Capillary: 191 mg/dL — ABNORMAL HIGH (ref 70–99)
Glucose-Capillary: 191 mg/dL — ABNORMAL HIGH (ref 70–99)
Glucose-Capillary: 197 mg/dL — ABNORMAL HIGH (ref 70–99)
Glucose-Capillary: 201 mg/dL — ABNORMAL HIGH (ref 70–99)
Glucose-Capillary: 201 mg/dL — ABNORMAL HIGH (ref 70–99)
Glucose-Capillary: 209 mg/dL — ABNORMAL HIGH (ref 70–99)
Glucose-Capillary: 211 mg/dL — ABNORMAL HIGH (ref 70–99)
Glucose-Capillary: 214 mg/dL — ABNORMAL HIGH (ref 70–99)
Glucose-Capillary: 217 mg/dL — ABNORMAL HIGH (ref 70–99)

## 2020-07-16 LAB — BASIC METABOLIC PANEL
Anion gap: 19 — ABNORMAL HIGH (ref 5–15)
Anion gap: 18 — ABNORMAL HIGH (ref 5–15)
Anion gap: 18 — ABNORMAL HIGH (ref 5–15)
Anion gap: 19 — ABNORMAL HIGH (ref 5–15)
Anion gap: 15 (ref 5–15)
BUN: 20 mg/dL (ref 6–20)
BUN: 15 mg/dL (ref 6–20)
BUN: 15 mg/dL (ref 6–20)
BUN: 13 mg/dL (ref 6–20)
BUN: 11 mg/dL (ref 6–20)
CO2: 16 mmol/L — ABNORMAL LOW (ref 22–32)
CO2: 18 mmol/L — ABNORMAL LOW (ref 22–32)
CO2: 19 mmol/L — ABNORMAL LOW (ref 22–32)
CO2: 20 mmol/L — ABNORMAL LOW (ref 22–32)
CO2: 23 mmol/L (ref 22–32)
Calcium: 9.1 mg/dL (ref 8.9–10.3)
Calcium: 9 mg/dL (ref 8.9–10.3)
Calcium: 9 mg/dL (ref 8.9–10.3)
Calcium: 9.2 mg/dL (ref 8.9–10.3)
Calcium: 9.1 mg/dL (ref 8.9–10.3)
Chloride: 101 mmol/L (ref 98–111)
Chloride: 100 mmol/L (ref 98–111)
Chloride: 98 mmol/L (ref 98–111)
Chloride: 99 mmol/L (ref 98–111)
Chloride: 99 mmol/L (ref 98–111)
Creatinine, Ser: 1.32 mg/dL — ABNORMAL HIGH (ref 0.61–1.24)
Creatinine, Ser: 1.21 mg/dL (ref 0.61–1.24)
Creatinine, Ser: 1.21 mg/dL (ref 0.61–1.24)
Creatinine, Ser: 1.08 mg/dL (ref 0.61–1.24)
Creatinine, Ser: 1.07 mg/dL (ref 0.61–1.24)
GFR, Estimated: >60 mL/min (ref >60)
GFR, Estimated: >60 mL/min (ref >60)
GFR, Estimated: >60 mL/min (ref >60)
GFR, Estimated: >60 mL/min (ref >60)
GFR, Estimated: >60 mL/min (ref >60)
Glucose, Bld: 222 mg/dL — ABNORMAL HIGH (ref 70–99)
Glucose, Bld: 163 mg/dL — ABNORMAL HIGH (ref 70–99)
Glucose, Bld: 171 mg/dL — ABNORMAL HIGH (ref 70–99)
Glucose, Bld: 167 mg/dL — ABNORMAL HIGH (ref 70–99)
Glucose, Bld: 187 mg/dL — ABNORMAL HIGH (ref 70–99)
Potassium: 3.8 mmol/L (ref 3.5–5.1)
Potassium: 3.1 mmol/L — ABNORMAL LOW (ref 3.5–5.1)
Potassium: 3.3 mmol/L — ABNORMAL LOW (ref 3.5–5.1)
Potassium: 2.9 mmol/L — ABNORMAL LOW (ref 3.5–5.1)
Potassium: 3.1 mmol/L — ABNORMAL LOW (ref 3.5–5.1)
Sodium: 136 mmol/L (ref 135–145)
Sodium: 135 mmol/L (ref 135–145)
Sodium: 136 mmol/L (ref 135–145)
Sodium: 138 mmol/L (ref 135–145)
Sodium: 137 mmol/L (ref 135–145)

## 2020-07-16 LAB — DRUG SCREEN 764883 11+OXYCO+ALC+CRT-BUND
Amphetamines, Urine: NEGATIVE ng/mL
BENZODIAZ UR QL: NEGATIVE ng/mL
Barbiturate: NEGATIVE ng/mL
Cannabinoid Quant, Ur: NEGATIVE ng/mL
Cocaine (Metabolite): NEGATIVE ng/mL
Creatinine: 45.8 mg/dL (ref 20.0–300.0)
Ethanol: NEGATIVE %
Meperidine: NEGATIVE ng/mL
Methadone Screen, Urine: NEGATIVE ng/mL
OPIATE SCREEN URINE: NEGATIVE ng/mL
Oxycodone/Oxymorphone, Urine: NEGATIVE ng/mL
Phencyclidine: NEGATIVE ng/mL
Propoxyphene, Urine: NEGATIVE ng/mL
Tramadol: NEGATIVE ng/mL
pH, Urine: 4.7 (ref 4.5–8.9)

## 2020-07-16 LAB — CBC
HCT: 37.4 % — ABNORMAL LOW (ref 39.0–52.0)
Hemoglobin: 13.4 g/dL (ref 13.0–17.0)
MCH: 31.4 pg (ref 26.0–34.0)
MCHC: 35.8 g/dL (ref 30.0–36.0)
MCV: 87.6 fL (ref 80.0–100.0)
Platelets: 162 10*3/uL (ref 150–400)
RBC: 4.27 MIL/uL (ref 4.22–5.81)
RDW: 12.3 % (ref 11.5–15.5)
WBC: 10.9 10*3/uL — ABNORMAL HIGH (ref 4.0–10.5)
nRBC: 0 % (ref 0.0–0.2)

## 2020-07-16 LAB — PHOSPHORUS
Phosphorus: 1.6 mg/dL — ABNORMAL LOW (ref 2.5–4.6)
Phosphorus: 1.2 mg/dL — ABNORMAL LOW (ref 2.5–4.6)

## 2020-07-16 LAB — HEMOGLOBIN A1C
Hgb A1c MFr Bld: 11.8 % — ABNORMAL HIGH (ref 4.8–5.6)
Mean Plasma Glucose: 291.96 mg/dL

## 2020-07-16 LAB — MAGNESIUM
Magnesium: 2 mg/dL (ref 1.7–2.4)
Magnesium: 2 mg/dL (ref 1.7–2.4)

## 2020-07-16 LAB — HIV ANTIBODY (ROUTINE TESTING W REFLEX): HIV Screen 4th Generation wRfx: NONREACTIVE

## 2020-07-16 MED ORDER — K PHOS MONO-SOD PHOS DI & MONO 155-852-130 MG PO TABS
250.0000 mg | ORAL_TABLET | Freq: Three times a day (TID) | ORAL | Status: AC
  Administered 2020-07-16 (×3): 250 mg via ORAL
  Filled 2020-07-16 (×3): qty 1

## 2020-07-16 MED ORDER — POTASSIUM CHLORIDE 10 MEQ/100ML IV SOLN
10.0000 meq | INTRAVENOUS | Status: AC
  Administered 2020-07-16 – 2020-07-17 (×4): 10 meq via INTRAVENOUS
  Filled 2020-07-16 (×5): qty 100

## 2020-07-16 MED ORDER — ACETAMINOPHEN 325 MG PO TABS
650.0000 mg | ORAL_TABLET | Freq: Four times a day (QID) | ORAL | Status: DC | PRN
  Administered 2020-07-16 – 2020-07-17 (×2): 650 mg via ORAL
  Filled 2020-07-16 (×2): qty 2

## 2020-07-16 MED ORDER — INSULIN STARTER KIT- PEN NEEDLES (ENGLISH)
1.0000 | Freq: Once | Status: DC
  Filled 2020-07-16: qty 1

## 2020-07-16 MED ORDER — POTASSIUM CHLORIDE CRYS ER 20 MEQ PO TBCR
40.0000 meq | EXTENDED_RELEASE_TABLET | ORAL | Status: DC
  Administered 2020-07-16: 40 meq via ORAL
  Filled 2020-07-16: qty 2

## 2020-07-16 MED ORDER — POTASSIUM CHLORIDE 10 MEQ/100ML IV SOLN
10.0000 meq | INTRAVENOUS | Status: AC
  Administered 2020-07-16 (×4): 10 meq via INTRAVENOUS
  Filled 2020-07-16 (×4): qty 100

## 2020-07-16 MED ORDER — LIVING WELL WITH DIABETES BOOK
Freq: Once | Status: DC
  Filled 2020-07-16: qty 1

## 2020-07-16 MED ORDER — POTASSIUM CHLORIDE 20 MEQ PO PACK
40.0000 meq | PACK | Freq: Once | ORAL | Status: AC
  Administered 2020-07-16: 40 meq via ORAL
  Filled 2020-07-16: qty 2

## 2020-07-16 NOTE — Progress Notes (Addendum)
Inpatient Diabetes Program Recommendations  AACE/ADA: New Consensus Statement on Inpatient Glycemic Control (2015)  Target Ranges:  Prepandial:   less than 140 mg/dL      Peak postprandial:   less than 180 mg/dL (1-2 hours)      Critically ill patients:  140 - 180 mg/dL   Lab Results  Component Value Date   GLUCAP 165 (H) 07/16/2020   HGBA1C 11.8 (H) 07/15/2020   Results for Clayton Boyd, Clayton Boyd (MRN [id number]) as of 07/16/2020 08:30  Ref. Range 07/15/2020 23:24  CO2 Latest Ref Range: 22 - 32 mmol/L 16 (L)  Results for Clayton, Boyd (MRN [id number]) as of 07/16/2020 08:30  Ref. Range 07/15/2020 23:24  Anion gap Latest Ref Range: 5 - 15  19 (H)   Results for Clayton, Boyd (MRN [id number]) as of 07/16/2020 08:30  Ref. Range 07/15/2020 20:59  Beta-Hydroxybutyric Acid Latest Ref Range: 0.05 - 0.27 mmol/L 6.85 (H)   Diabetes history:  New onset DM Current orders for Inpatient glycemic control:  IV Insulin  Inpatient Diabetes Program Recommendations:    When criteria is met and MD is ready to transition to sq insulin please consider:  -Lantus 18 units 2 hours prior to discontinuing IV insulin. -Novolog 0-15 units TID and 0-5 QHS  Addendum_0 :  Might consider D10 to assist with clearing acidosis or allow patient to eat.  Spoke with patient at bedside about new diagnosis of diabetes. Discussed A1C results with him of 11.8% (average blood sugar of 292 mg/dL) and explained what an A1C is, basic pathophysiology of DM Type 2, basic home care, basic diabetes diet nutrition principles, importance of checking CBGs and maintaining good CBG control to prevent long-term and short-term complications. Reviewed signs and symptoms of hyperglycemia and hypoglycemia and how to treat hypoglycemia at home. Also reviewed blood sugar goals at home.  RNs to provide ongoing basic DM education at bedside with this patient. Have ordered educational booklet, insulin starter kit, and DM videos. Have also  placed RD consult for DM diet education for this patient.  Please use each patient interaction to provide diabetes education. Please review Living Well with Diabetes booklet with the patient, have patient watch patient education videos on diabetes, and instruct on insulin administration. Please allow patient to be actively engaged with diabetes management by allowing patient to check own glucose and self-administer insulin injections. Diabetes Coordinator will follow up with patient and reinforce diabetes education.  Educated patient on insulin pen use at home. Reviewed contents of insulin flexpen starter kit. Reviewed all steps of insulin pen including attachment of needle, 2-unit air shot, dialing up dose, giving injection, removing needle, disposal of sharps, storage of unused insulin, disposal of insulin etc.  MD to give patient Rxs for insulin pens and insulin pen needles.    He states he does drink beverages with sugar and especially likes Ginger Ale.  Encouraged him to eliminate drinks with sugar and switch to diet/zero and or water.  Discussed The Plate Method and CHO's.    He is aware he will be discharging on insulin.  We discussed hypoglycemia, signs, symptoms and treatment.  He is going to check CBG's every morning and 2 hours after lunch and before bedtime. Provided patient with a glucometer and he is aware he can purchase more strips at [employer].  He is to call PCP for blood sugars consistently > 200 mg/dL and <100 mg/dL.  Will attached education on insulin and diabetes to AVS.    Placed TOC consult  for medication assistance as he is uninsured.  If patient does not wish to follow up with [facility] might consider more affordable insulin such as 70/30 which can be purchased at [facility] for $43.00 for a box of 5 insulin pens.    Will continue to follow while inpatient.  Thank you, Reche Dixon, RN, BSN Diabetes Coordinator Inpatient Diabetes Program [phone] (team pager from  8a-5p)

## 2020-07-16 NOTE — Progress Notes (Signed)
PHARMACY CONSULT NOTE - FOLLOW UP  Pharmacy Consult for Electrolyte Monitoring and Replacement   Recent Labs: Potassium (mmol/L)  Date Value  07/16/2020 3.1 (L)   Magnesium (mg/dL)  Date Value  67/61/9509 2.0   Calcium (mg/dL)  Date Value  32/67/1245 9.1   Albumin (g/dL)  Date Value  80/99/8338 5.2 (H)   Phosphorus (mg/dL)  Date Value  25/10/3974 1.2 (L)   Sodium (mmol/L)  Date Value  07/16/2020 137   Assessment: 30 y.o. male who presents to the ED for evaluation of abdominal pain and emesis. *Patient only able to tolerate 1 of 2 KCl tablets for first dose.  Goal of Therapy:  K >/= 4 on insulin drip, other lytes WNL  Plan:  Anion gap remains elevated. Patient continuing on insulin drip. Pt received KCl 40 mEq PO x 2 doses and KCl IV 10 mEq x 8 doses. K 2.9>3.1. Second run of IV K completed @ 1951- expect K to increase ~0.4 from this. Will order another 4 doses of KCl 10 mEq IV.   BMP ordered for 1/22 0000 - f/u electrolyte levels  Reatha Armour, PharmD Pharmacy Resident  07/16/2020 8:45 PM

## 2020-07-16 NOTE — Progress Notes (Signed)
PHARMACY CONSULT NOTE - FOLLOW UP  Pharmacy Consult for Electrolyte Monitoring and Replacement   Recent Labs: Potassium (mmol/L)  Date Value  07/15/2020 3.8   Magnesium (mg/dL)  Date Value  83/72/9021 2.0   Calcium (mg/dL)  Date Value  11/55/2080 9.1   Albumin (g/dL)  Date Value  22/33/6122 5.2 (H)   Phosphorus (mg/dL)  Date Value  44/97/5300 1.6 (L)   Sodium (mmol/L)  Date Value  07/15/2020 136   Assessment: 30 y.o. malewho presents to the ED for evaluation of abdominal pain and emesis.  Goal of Therapy:  K >/= 4 in ICU, other lytes WNL  Plan:  Pt has received KCL IV q1h x 4 K phos packet 250mg  q8h No additional replacement needed at this time, f/u labs in am  ,PharmD Clinical Pharmacist 07/16/2020 2:57 AM

## 2020-07-16 NOTE — Progress Notes (Signed)
CRITICAL CARE NOTE DKA New onset DM  CC  follow up DKA SUBJECTIVE Patient remains critically ill Complains of mild nausea and headache.  No chest pain shortness of breath or cough.  No abdominal pain or vomiting or diarrhea.  No fever chills rigors.  No rashes.  No weakness no GU complaints.  Feels hungry and wants to eat.  Has been tolerating water and ice.      SIGNIFICANT EVENTS    BP (!) 163/82   Pulse 87   Temp 98.7 F (37.1 C) (Oral)   Resp (!) 28   Ht 5\' 6"  (1.676 m)   Wt 135.2 kg   SpO2 99%   BMI 48.11 kg/m    REVIEW OF SYSTEMS  As above  PHYSICAL EXAMINATION:  GENERAL:critically ill appearing, no resp distress HEAD: Normocephalic, atraumatic.  EYES: Pupils equal, round, reactive to light.  No scleral icterus.  MOUTH: Moist mucosal membrane. NECK: Supple. No thyromegaly. No nodules. No JVD.  PULMONARY: CTA  CARDIOVASCULAR: S1 and S2. Regular rate and rhythm. No murmurs, rubs, or gallops.  GASTROINTESTINAL: Soft, nontender, -distended. No masses. Positive bowel sounds. No hepatosplenomegaly.  MUSCULOSKELETAL: No swelling, clubbing, or edema.  NEUROLOGIC: Awake and alert nonfocal  SKIN:intact,warm,dry  INTAKE/OUTPUT  Intake/Output Summary (Last 24 hours) at 07/16/2020 1328 Last data filed at 07/16/2020 0919 Gross per 24 hour  Intake 6309.36 ml  Output 2025 ml  Net 4284.36 ml    LABS  CBC Recent Labs  Lab 07/15/20 0047 07/15/20 0927 07/16/20 0743  WBC 10.6* 23.3* 10.9*  HGB 17.5* 17.3* 13.4  HCT 52.0 52.2* 37.4*  PLT 261 262 162   Coag's No results for input(s): APTT, INR in the last 168 hours. BMET Recent Labs  Lab 07/15/20 2059 07/15/20 2324 07/16/20 0743  NA 134* 136 135  136  K 3.6 3.8 3.1*  3.3*  CL 100 101 98  100  CO2 13* 16* 19*  18*  BUN 22* 20 15  15   CREATININE 1.41* 1.32* 1.21  1.21  GLUCOSE 227* 222* 171*  163*   Electrolytes Recent Labs  Lab 07/15/20 2059 07/15/20 2324 07/16/20 0743  CALCIUM 9.0 9.1  9.0  9.0  MG  --  2.0 2.0  PHOS  --  1.6* 1.2*   Sepsis Markers Recent Labs  Lab 07/15/20 1442  PROCALCITON 2.67   ABG No results for input(s): PHART, PCO2ART, PO2ART in the last 168 hours. Liver Enzymes Recent Labs  Lab 07/15/20 0047 07/15/20 0927  AST 22 32  ALT 21 19  ALKPHOS 62 67  BILITOT 1.9* 1.9*  ALBUMIN 5.3* 5.2*   Cardiac Enzymes No results for input(s): TROPONINI, PROBNP in the last 168 hours. Glucose Recent Labs  Lab 07/16/20 0608 07/16/20 0733 07/16/20 0916 07/16/20 1030 07/16/20 1138 07/16/20 1326  GLUCAP 181* 165* 167* 209* 170* 175*     Recent Results (from the past 240 hour(s))  SARS Coronavirus 2 by RT PCR (hospital order, performed in [facility] hospital lab) Nasopharyngeal Nasopharyngeal Swab     Status: None   Collection Time: 07/15/20  9:54 AM   Specimen: Nasopharyngeal Swab  Result Value Ref Range Status   SARS Coronavirus 2 NEGATIVE NEGATIVE Final    Comment: (NOTE) SARS-CoV-2 target nucleic acids are NOT DETECTED.  The SARS-CoV-2 RNA is generally detectable in upper and lower respiratory specimens during the acute phase of infection. The lowest concentration of SARS-CoV-2 viral copies this assay can detect is 250 copies / mL. A negative result does  not preclude SARS-CoV-2 infection and should not be used as the sole basis for treatment or other patient management decisions.  A negative result may occur with improper specimen collection / handling, submission of specimen other than nasopharyngeal swab, presence of viral mutation(s) within the areas targeted by this assay, and inadequate number of viral copies (<250 copies / mL). A negative result must be combined with clinical observations, patient history, and epidemiological information.  Fact Sheet for Patients:   BoilerBrush.com.cy  Fact Sheet for Healthcare Providers: https://pope.com/  This test is not yet approved or   cleared by the [place] FDA and has been authorized for detection and/or diagnosis of SARS-CoV-2 by FDA under an Emergency Use Authorization (EUA).  This EUA will remain in effect (meaning this test can be used) for the duration of the COVID-19 declaration under Section 564(b)(1) of the Act, 21 U.S.C. section 360bbb-3(b)(1), unless the authorization is terminated or revoked sooner.  Performed at [facility], [address] [place], [place] [address]   Culture, blood (Routine X 2) w Reflex to ID Panel     Status: None (Preliminary result)   Collection Time: 07/15/20  8:59 PM   Specimen: BLOOD  Result Value Ref Range Status   Specimen Description BLOOD RIGHT HAND  Final   Special Requests   Final    BOTTLES DRAWN AEROBIC AND ANAEROBIC Blood Culture adequate volume   Culture   Final    NO GROWTH < 12 HOURS Performed at [facility], [address], [place], [place] [address]    Report Status PENDING  Incomplete  Culture, blood (Routine X 2) w Reflex to ID Panel     Status: None (Preliminary result)   Collection Time: 07/15/20  8:59 PM   Specimen: BLOOD  Result Value Ref Range Status   Specimen Description BLOOD LEFT HAND  Final   Special Requests   Final    BOTTLES DRAWN AEROBIC AND ANAEROBIC Blood Culture adequate volume   Culture   Final    NO GROWTH < 12 HOURS Performed at [facility], [address] [place], [place] [address]    Report Status PENDING  Incomplete  MRSA PCR Screening     Status: None   Collection Time: 07/15/20 10:03 PM   Specimen: Nasopharyngeal  Result Value Ref Range Status   MRSA by PCR NEGATIVE NEGATIVE Final    Comment:        The GeneXpert MRSA Assay (FDA approved for NASAL specimens only), is one component of a comprehensive MRSA colonization surveillance program. It is not intended to diagnose MRSA infection nor to guide or monitor treatment for MRSA infections. Performed at [facility], [address], [place], [place] [address]     MEDICATIONS   Current Facility-Administered Medications:  .  Chlorhexidine Gluconate Cloth 2 % PADS 6 each, 6 each, Topical, Daily, Eugenie Norrie, NP, 6 each at 07/16/20 1027 .  dextrose 5 % in lactated ringers infusion, , Intravenous, Continuous, Delton Prairie, MD, Last Rate: 125 mL/hr at 07/16/20 1027, New Bag at 07/16/20 1027 .  dextrose 50 % solution 0-50 mL, 0-50 mL, Intravenous, PRN, Delton Prairie, MD .  docusate sodium (COLACE) capsule 100 mg, 100 mg, Oral, BID PRN, Melody Comas B, MD .  heparin injection 5,000 Units, 5,000 Units, Subcutaneous, Q8H, Martina Sinner, MD, 5,000 Units at 07/16/20 [phone] .  insulin regular, human (MYXREDLIN) 100 units/ 100 mL infusion, , Intravenous, Continuous, Delton Prairie, MD, Last Rate:  3.8 mL/hr at August 14, 2020 1327, 3.8 Units/hr at 08-14-20 1327 .  ondansetron (ZOFRAN) injection 4 mg, 4 mg, Intravenous, Q6H PRN, Melody Comas B, MD .  pantoprazole (PROTONIX) injection 40 mg, 40 mg, Intravenous, QHS, Martina Sinner, MD, 40 mg at 07/15/20 2108 .  phosphorus (K PHOS NEUTRAL) tablet 250 mg, 250 mg, Oral, Q8H, Blakeney, Dana G, NP, 250 mg at 14-Aug-2020 1049 .  polyethylene glycol (MIRALAX / GLYCOLAX) packet 17 g, 17 g, Oral, Daily PRN, Melody Comas B, MD .  potassium chloride SA (KLOR-CON) CR tablet 40 mEq, 40 mEq, Oral, Q4H, Dewald, Jonathan B, MD .  sodium bicarbonate 150 mEq in sterile water 1,000 mL infusion, , Intravenous, Continuous, Delton Prairie, MD, Last Rate: 100 mL/hr at August 14, 2020 1026, New Bag at 08-14-2020 1026 .  sodium chloride flush (NS) 0.9 % injection 10-40 mL, 10-40 mL, Intracatheter, Q12H, Melody Comas B, MD, 10 mL at 14-Aug-2020 1027 .  sodium chloride flush (NS) 0.9 % injection 10-40 mL, 10-40 mL, Intracatheter, PRN, Martina Sinner, MD      Indwelling Urinary Catheter continued, requirement due to   Reason to continue Indwelling Urinary Catheter for strict Intake/Output  monitoring for hemodynamic instability   Central Line continued, requirement due to   Reason to continue [employer] Monitoring of central venous pressure or other hemodynamic parameters   Ventilator continued, requirement due to, resp failure    Ventilator Sedation RASS 0 to -2     ASSESSMENT AND PLAN SYNOPSIS   Diabetic Ketoacidosis In setting of new diagnosis of diabetes and possible pancreatitis - Continue insulin drip - Bolus with lactated ringers as needed - off hco3 drip- BMP q4hrs  -  hemoglobin a1c 11.8 - follow blood cultures, procalcitonin elevated   Chest radiograph not indicative of pneumonia and UA does not appear infected.  Acute Kidney Injury - Pre-renal in etiology due to hypovolemia -resolved - Urinalysis unremarkable  Normal  renal u/s  ?Pancreatitis - Possible pancreatitis given elevated lipase and abdominal pain No pain or tenderness today  - consider  CT abdomen once patient is more stable, possibly with contrast based on renal function - -  Triglycerides nl   Best practice (evaluated daily)  Diet: NPO Pain/Anxiety/Delirium protocol (if indicated): n/a VAP protocol (if indicated): n/a DVT prophylaxis: heparin GI prophylaxis: pantoprazole Glucose control: Insulin gtt Mobility: bed rest Disposition:ICU Code Status: Full   Critical Care Time devoted to patient care services described in this note is 31 minutes.   Overall, patient is critically ill, prognosis is guarded.  Patient with Multiorgan failure and at high risk for cardiac arrest and death.   Arbie Cookey, MD  08/14/2020 1:28 PM [facility]

## 2020-07-16 NOTE — Progress Notes (Addendum)
PHARMACY CONSULT NOTE - FOLLOW UP  Pharmacy Consult for Electrolyte Monitoring and Replacement   Recent Labs: Potassium (mmol/L)  Date Value  07/16/2020 3.1 (L)  07/16/2020 3.3 (L)   Magnesium (mg/dL)  Date Value  43/15/4008 2.0   Calcium (mg/dL)  Date Value  67/61/9509 9.0  07/16/2020 9.0   Albumin (g/dL)  Date Value  32/67/1245 5.2 (H)   Phosphorus (mg/dL)  Date Value  80/99/8338 1.2 (L)   Sodium (mmol/L)  Date Value  07/16/2020 135  07/16/2020 136   Assessment: 30 y.o. malewho presents to the ED for evaluation of abdominal pain and emesis.  Goal of Therapy:  K >/= 4 on insulin drip, other lytes WNL  Plan:  Receiving K phos neutral tabs. Potassium 40 mEq PO x 2 ordered. Patient only able to tolerate 1 of 2 tablets for first dose. Will attempt potassium 40 mEq PO packet. If unable to tolerate, will give IV potassium.  Repeat BMP ~ 1500 with K 2.9. Anion gap remains elevated. Patient continuing on insulin drip. Will order potassium 10 mEq IV x 4 runs in addition to the 40 mEq PO ordered.  Pricilla Riffle ,PharmD Clinical Pharmacist 07/16/2020 2:29 PM

## 2020-07-17 DIAGNOSIS — E860 Dehydration: Secondary | ICD-10-CM | POA: Diagnosis present

## 2020-07-17 LAB — BASIC METABOLIC PANEL
Anion gap: 15 (ref 5–15)
Anion gap: 13 (ref 5–15)
Anion gap: 11 (ref 5–15)
Anion gap: 14 (ref 5–15)
Anion gap: 15 (ref 5–15)
BUN: 11 mg/dL (ref 6–20)
BUN: 10 mg/dL (ref 6–20)
BUN: 10 mg/dL (ref 6–20)
BUN: 9 mg/dL (ref 6–20)
BUN: 9 mg/dL (ref 6–20)
CO2: 22 mmol/L (ref 22–32)
CO2: 21 mmol/L — ABNORMAL LOW (ref 22–32)
CO2: 23 mmol/L (ref 22–32)
CO2: 22 mmol/L (ref 22–32)
CO2: 21 mmol/L — ABNORMAL LOW (ref 22–32)
Calcium: 8.9 mg/dL (ref 8.9–10.3)
Calcium: 8.6 mg/dL — ABNORMAL LOW (ref 8.9–10.3)
Calcium: 8.7 mg/dL — ABNORMAL LOW (ref 8.9–10.3)
Calcium: 9.1 mg/dL (ref 8.9–10.3)
Calcium: 8.7 mg/dL — ABNORMAL LOW (ref 8.9–10.3)
Chloride: 99 mmol/L (ref 98–111)
Chloride: 100 mmol/L (ref 98–111)
Chloride: 102 mmol/L (ref 98–111)
Chloride: 103 mmol/L (ref 98–111)
Chloride: 101 mmol/L (ref 98–111)
Creatinine, Ser: 1.01 mg/dL (ref 0.61–1.24)
Creatinine, Ser: 0.9 mg/dL (ref 0.61–1.24)
Creatinine, Ser: 0.9 mg/dL (ref 0.61–1.24)
Creatinine, Ser: 0.81 mg/dL (ref 0.61–1.24)
Creatinine, Ser: 0.91 mg/dL (ref 0.61–1.24)
GFR, Estimated: >60 mL/min (ref >60)
GFR, Estimated: >60 mL/min (ref >60)
GFR, Estimated: >60 mL/min (ref >60)
GFR, Estimated: >60 mL/min (ref >60)
GFR, Estimated: >60 mL/min (ref >60)
Glucose, Bld: 221 mg/dL — ABNORMAL HIGH (ref 70–99)
Glucose, Bld: 180 mg/dL — ABNORMAL HIGH (ref 70–99)
Glucose, Bld: 196 mg/dL — ABNORMAL HIGH (ref 70–99)
Glucose, Bld: 147 mg/dL — ABNORMAL HIGH (ref 70–99)
Glucose, Bld: 196 mg/dL — ABNORMAL HIGH (ref 70–99)
Potassium: 2.9 mmol/L — ABNORMAL LOW (ref 3.5–5.1)
Potassium: 2.8 mmol/L — ABNORMAL LOW (ref 3.5–5.1)
Potassium: 2.9 mmol/L — ABNORMAL LOW (ref 3.5–5.1)
Potassium: 3.2 mmol/L — ABNORMAL LOW (ref 3.5–5.1)
Potassium: 3.5 mmol/L (ref 3.5–5.1)
Sodium: 136 mmol/L (ref 135–145)
Sodium: 134 mmol/L — ABNORMAL LOW (ref 135–145)
Sodium: 136 mmol/L (ref 135–145)
Sodium: 139 mmol/L (ref 135–145)
Sodium: 137 mmol/L (ref 135–145)

## 2020-07-17 LAB — GLUCOSE, CAPILLARY
Glucose-Capillary: 139 mg/dL — ABNORMAL HIGH (ref 70–99)
Glucose-Capillary: 159 mg/dL — ABNORMAL HIGH (ref 70–99)
Glucose-Capillary: 167 mg/dL — ABNORMAL HIGH (ref 70–99)
Glucose-Capillary: 170 mg/dL — ABNORMAL HIGH (ref 70–99)
Glucose-Capillary: 174 mg/dL — ABNORMAL HIGH (ref 70–99)
Glucose-Capillary: 175 mg/dL — ABNORMAL HIGH (ref 70–99)
Glucose-Capillary: 179 mg/dL — ABNORMAL HIGH (ref 70–99)
Glucose-Capillary: 179 mg/dL — ABNORMAL HIGH (ref 70–99)
Glucose-Capillary: 185 mg/dL — ABNORMAL HIGH (ref 70–99)
Glucose-Capillary: 185 mg/dL — ABNORMAL HIGH (ref 70–99)
Glucose-Capillary: 185 mg/dL — ABNORMAL HIGH (ref 70–99)
Glucose-Capillary: 186 mg/dL — ABNORMAL HIGH (ref 70–99)
Glucose-Capillary: 189 mg/dL — ABNORMAL HIGH (ref 70–99)
Glucose-Capillary: 192 mg/dL — ABNORMAL HIGH (ref 70–99)
Glucose-Capillary: 193 mg/dL — ABNORMAL HIGH (ref 70–99)
Glucose-Capillary: 198 mg/dL — ABNORMAL HIGH (ref 70–99)
Glucose-Capillary: 205 mg/dL — ABNORMAL HIGH (ref 70–99)
Glucose-Capillary: 233 mg/dL — ABNORMAL HIGH (ref 70–99)

## 2020-07-17 LAB — MAGNESIUM: Magnesium: 2 mg/dL (ref 1.7–2.4)

## 2020-07-17 LAB — CBC
HCT: 34.6 % — ABNORMAL LOW (ref 39.0–52.0)
Hemoglobin: 12.1 g/dL — ABNORMAL LOW (ref 13.0–17.0)
MCH: 30.8 pg (ref 26.0–34.0)
MCHC: 35 g/dL (ref 30.0–36.0)
MCV: 88 fL (ref 80.0–100.0)
Platelets: 145 10*3/uL — ABNORMAL LOW (ref 150–400)
RBC: 3.93 MIL/uL — ABNORMAL LOW (ref 4.22–5.81)
RDW: 12.4 % (ref 11.5–15.5)
WBC: 6.6 10*3/uL (ref 4.0–10.5)
nRBC: 0 % (ref 0.0–0.2)

## 2020-07-17 LAB — BETA-HYDROXYBUTYRIC ACID: Beta-Hydroxybutyric Acid: 4.86 mmol/L — ABNORMAL HIGH (ref 0.05–0.27)

## 2020-07-17 LAB — PHOSPHORUS: Phosphorus: 1.1 mg/dL — ABNORMAL LOW (ref 2.5–4.6)

## 2020-07-17 LAB — LIPASE, BLOOD: Lipase: 54 U/L — ABNORMAL HIGH (ref 11–51)

## 2020-07-17 MED ORDER — POTASSIUM CHLORIDE 10 MEQ/100ML IV SOLN
10.0000 meq | INTRAVENOUS | Status: AC
  Administered 2020-07-17 (×3): 10 meq via INTRAVENOUS
  Filled 2020-07-17 (×4): qty 100

## 2020-07-17 MED ORDER — POTASSIUM CHLORIDE 10 MEQ/100ML IV SOLN
10.0000 meq | INTRAVENOUS | Status: AC
  Administered 2020-07-17 (×4): 10 meq via INTRAVENOUS
  Filled 2020-07-17 (×4): qty 100

## 2020-07-17 MED ORDER — INSULIN GLARGINE 100 UNIT/ML ~~LOC~~ SOLN
18.0000 [IU] | Freq: Once | SUBCUTANEOUS | Status: AC
  Administered 2020-07-17: 18 [IU] via SUBCUTANEOUS
  Filled 2020-07-17: qty 0.18

## 2020-07-17 MED ORDER — INSULIN ASPART 100 UNIT/ML ~~LOC~~ SOLN: 0.0000 [IU] | Freq: Every day | SUBCUTANEOUS | Status: DC

## 2020-07-17 MED ORDER — POTASSIUM CHLORIDE CRYS ER 20 MEQ PO TBCR
40.0000 meq | EXTENDED_RELEASE_TABLET | Freq: Once | ORAL | Status: AC
  Administered 2020-07-17: 40 meq via ORAL
  Filled 2020-07-17: qty 2

## 2020-07-17 MED ORDER — INSULIN ASPART 100 UNIT/ML ~~LOC~~ SOLN
0.0000 [IU] | Freq: Three times a day (TID) | SUBCUTANEOUS | Status: DC
  Administered 2020-07-17: 2 [IU] via SUBCUTANEOUS
  Filled 2020-07-17: qty 1

## 2020-07-17 MED ORDER — K PHOS MONO-SOD PHOS DI & MONO 155-852-130 MG PO TABS
500.0000 mg | ORAL_TABLET | ORAL | Status: AC
  Administered 2020-07-17 (×3): 500 mg via ORAL
  Filled 2020-07-17 (×3): qty 2

## 2020-07-17 MED ORDER — INSULIN ASPART 100 UNIT/ML ~~LOC~~ SOLN
0.0000 [IU] | Freq: Three times a day (TID) | SUBCUTANEOUS | Status: DC
  Administered 2020-07-17: 3 [IU] via SUBCUTANEOUS
  Administered 2020-07-18 – 2020-07-19 (×5): 5 [IU] via SUBCUTANEOUS
  Filled 2020-07-17 (×6): qty 1

## 2020-07-17 MED ORDER — POTASSIUM CHLORIDE 20 MEQ PO PACK
40.0000 meq | PACK | Freq: Two times a day (BID) | ORAL | Status: DC
  Administered 2020-07-17 (×2): 40 meq via ORAL
  Filled 2020-07-17 (×2): qty 2

## 2020-07-17 NOTE — Progress Notes (Addendum)
PHARMACY CONSULT NOTE - FOLLOW UP  Pharmacy Consult for Electrolyte Monitoring and Replacement   Recent Labs: Potassium (mmol/L)  Date Value  07/17/2020 2.9 (L)   Magnesium (mg/dL)  Date Value  92/95/7473 2.0   Calcium (mg/dL)  Date Value  40/37/0964 8.9   Albumin (g/dL)  Date Value  38/38/1840 5.2 (H)   Phosphorus (mg/dL)  Date Value  37/54/3606 1.2 (L)   Sodium (mmol/L)  Date Value  07/17/2020 136   Assessment: 30 y.o. male who presents to the ED for evaluation of abdominal pain and emesis. *Patient only able to tolerate 1 of 2 KCl tablets for first dose.  Goal of Therapy:  K >/= 4 on insulin drip, other lytes WNL  Plan:  K+ = 2.9.  Patient continuing on insulin drip. Will order another 4 runs of KCl 10 mEq IV. (q1h x 4 bags) and KCL PO.  - f/u electrolyte labs (BMP currently ordered q4h)  Wayland Denis, PharmD 07/17/2020 5:25 AM

## 2020-07-17 NOTE — Progress Notes (Signed)
CRITICAL CARE NOTE  CC  follow up DKA  SUBJECTIVE Has done well overnight and is tolerating p.o. feeds.  He has no nausea vomiting or diarrhea.  He has no abdominal pain he has no shortness of breath cough fever chills or rigors.  No GU complaints.  No rash joint pain.    SIGNIFICANT EVENTS    BP (!) 149/79   Pulse 81   Temp 98.8 F (37.1 C) (Oral)   Resp (!) 26   Ht _0  (1.676 m)   Wt 135.2 kg   SpO2 98%   BMI 48.11 kg/m    REVIEW OF SYSTEMS As above   PHYSICAL EXAMINATION:  GENERAL:critically ill appearing, no resp distress HEAD: Normocephalic, atraumatic.  EYES: Pupils equal, round, reactive to light.  No scleral icterus.  MOUTH: Moist mucosal membrane. NECK: Supple. No thyromegaly. No nodules. No JVD.  PULMONARY: Clear to auscultation  CARDIOVASCULAR: S1 and S2. Regular rate and rhythm. No murmurs, rubs, or gallops.  GASTROINTESTINAL: Soft, nontender, -distended. No masses. Positive bowel sounds. No hepatosplenomegaly.  MUSCULOSKELETAL: No swelling, clubbing, or edema.  NEUROLOGIC: Awake and alert nonfocal no deficits SKIN:intact,warm,dry  INTAKE/OUTPUT  Intake/Output Summary (Last 24 hours) at 07/17/2020 1126 Last data filed at 07/17/2020 0600 Gross per 24 hour  Intake 3154.59 ml  Output 1200 ml  Net 1954.59 ml    LABS  CBC Recent Labs  Lab 07/15/20 0927 07/16/20 0743 07/17/20 0817  WBC 23.3* 10.9* 6.6  HGB 17.3* 13.4 12.1*  HCT 52.2* 37.4* 34.6*  PLT 262 162 145*   Coag's No results for input(s): APTT, INR in the last 168 hours. BMET Recent Labs  Lab 07/16/20 1956 07/17/20 0441 07/17/20 0817  NA 137 136 134*  K 3.1* 2.9* 2.8*  CL 99 99 100  CO2 23 22 21*  BUN _1 CREATININE 1.07 1.01 0.90  GLUCOSE 187* 221* 180*   Electrolytes Recent Labs  Lab 07/15/20 2324 07/16/20 0743 07/16/20 1442 07/16/20 1956 07/17/20 0441 07/17/20 0817  CALCIUM 9.1 9.0  9.0   < > 9.1 8.9 8.6*  MG 2.0 2.0  --   --   --  2.0  PHOS 1.6* 1.2*   --   --   --  1.1*   < > = values in this interval not displayed.   Sepsis Markers Recent Labs  Lab 07/15/20 1442  PROCALCITON 2.67   ABG No results for input(s): PHART, PCO2ART, PO2ART in the last 168 hours. Liver Enzymes Recent Labs  Lab 07/15/20 0047 07/15/20 0927  AST 22 32  ALT 21 19  ALKPHOS 62 67  BILITOT 1.9* 1.9*  ALBUMIN 5.3* 5.2*   Cardiac Enzymes No results for input(s): TROPONINI, PROBNP in the last 168 hours. Glucose Recent Labs  Lab 07/17/20 0715 07/17/20 0734 07/17/20 0822 07/17/20 0915 07/17/20 0937 07/17/20 1033  GLUCAP 198* 179* 174* 186* 192* 185*     Recent Results (from the past 240 hour(s))  SARS Coronavirus 2 by RT PCR (hospital order, performed in [facility] hospital lab) Nasopharyngeal Nasopharyngeal Swab     Status: None   Collection Time: 07/15/20  9:54 AM   Specimen: Nasopharyngeal Swab  Result Value Ref Range Status   SARS Coronavirus 2 NEGATIVE NEGATIVE Final    Comment: (NOTE) SARS-CoV-2 target nucleic acids are NOT DETECTED.  The SARS-CoV-2 RNA is generally detectable in upper and lower respiratory specimens during the acute phase of infection. The lowest concentration of SARS-CoV-2 viral copies this assay can detect  is 250 copies / mL. A negative result does not preclude SARS-CoV-2 infection and should not be used as the sole basis for treatment or other patient management decisions.  A negative result may occur with improper specimen collection / handling, submission of specimen other than nasopharyngeal swab, presence of viral mutation(s) within the areas targeted by this assay, and inadequate number of viral copies (<250 copies / mL). A negative result must be combined with clinical observations, patient history, and epidemiological information.  Fact Sheet for Patients:   StrictlyIdeas.no  Fact Sheet for Healthcare Providers: BankingDealers.co.za  This test is not yet  approved or  cleared by the [place] FDA and has been authorized for detection and/or diagnosis of SARS-CoV-2 by FDA under an Emergency Use Authorization (EUA).  This EUA will remain in effect (meaning this test can be used) for the duration of the COVID-19 declaration under Section 564(b)(1) of the Act, 21 U.S.C. section 360bbb-3(b)(1), unless the authorization is terminated or revoked sooner.  Performed at [facility], [address] [place], [place] [address]   Culture, blood (Routine X 2) w Reflex to ID Panel     Status: None (Preliminary result)   Collection Time: 07/15/20  8:59 PM   Specimen: BLOOD  Result Value Ref Range Status   Specimen Description BLOOD RIGHT HAND  Final   Special Requests   Final    BOTTLES DRAWN AEROBIC AND ANAEROBIC Blood Culture adequate volume   Culture   Final    NO GROWTH 2 DAYS Performed at [facility], [address] [place], [place] [address]    Report Status PENDING  Incomplete  Culture, blood (Routine X 2) w Reflex to ID Panel     Status: None (Preliminary result)   Collection Time: 07/15/20  8:59 PM   Specimen: BLOOD  Result Value Ref Range Status   Specimen Description BLOOD LEFT HAND  Final   Special Requests   Final    BOTTLES DRAWN AEROBIC AND ANAEROBIC Blood Culture adequate volume   Culture   Final    NO GROWTH 2 DAYS Performed at [facility], [address], [place], [place] [address]    Report Status PENDING  Incomplete  MRSA PCR Screening     Status: None   Collection Time: 07/15/20 10:03 PM   Specimen: Nasopharyngeal  Result Value Ref Range Status   MRSA by PCR NEGATIVE NEGATIVE Final    Comment:        The GeneXpert MRSA Assay (FDA approved for NASAL specimens only), is one component of a comprehensive MRSA colonization surveillance program. It is not intended to diagnose MRSA infection nor to guide or monitor treatment for MRSA infections. Performed at [facility],  [address] [place], [place] [address]     MEDICATIONS   Current Facility-Administered Medications:  .  acetaminophen (TYLENOL) tablet 650 mg, 650 mg, Oral, Q6H PRN, Rust-Chester, Britton L, NP, 650 mg at 07/16/20 2235 .  Chlorhexidine Gluconate Cloth 2 % PADS 6 each, 6 each, Topical, Daily, Awilda Bill, NP, 6 each at 07/16/20 1027 .  dextrose 5 % in lactated ringers infusion, , Intravenous, Continuous, Rosine Door, MD, Last Rate: 75 mL/hr at 07/17/20 0042, New Bag at 07/17/20 0042 .  dextrose 50 % solution 0-50 mL, 0-50 mL, Intravenous, PRN, Vladimir Crofts, MD .  docusate sodium (COLACE) capsule 100 mg, 100 mg, Oral, BID PRN, Freda Jackson B, MD .  heparin injection 5,000 Units, 5,000 Units, Subcutaneous, Q8H,  Freddi Starr, MD, 5,000 Units at Aug 05, 2020 [phone] .  insulin regular, human (MYXREDLIN) 100 units/ 100 mL infusion, , Intravenous, Continuous, Vladimir Crofts, MD, Last Rate: 4.2 mL/hr at 08-05-2020 0930, 4.2 Units/hr at 05-Aug-2020 0930 .  insulin starter kit- pen needles (English) 1 kit, 1 kit, Other, Once, Freda Jackson B, MD .  living well with diabetes book MISC, , Does not apply, Once, Freda Jackson B, MD .  ondansetron [facility]) injection 4 mg, 4 mg, Intravenous, Q6H PRN, Freda Jackson B, MD .  pantoprazole (PROTONIX) injection 40 mg, 40 mg, Intravenous, QHS, Freddi Starr, MD, 40 mg at 07/16/20 2212 .  polyethylene glycol (MIRALAX / GLYCOLAX) packet 17 g, 17 g, Oral, Daily PRN, Freda Jackson B, MD .  sodium chloride flush (NS) 0.9 % injection 10-40 mL, 10-40 mL, Intracatheter, Q12H, Freddi Starr, MD, 10 mL at 07/16/20 2215 .  sodium chloride flush (NS) 0.9 % injection 10-40 mL, 10-40 mL, Intracatheter, PRN, Freddi Starr, MD      Indwelling Urinary Catheter continued, requirement due to   Reason to continue Indwelling Urinary Catheter for strict Intake/Output monitoring for hemodynamic instability   Central Line continued, requirement  due to   Reason to continue [employer] Monitoring of central venous pressure or other hemodynamic parameters   Ventilator continued, requirement due to, resp failure    Ventilator Sedation RASS 0 to -2     ASSESSMENT AND PLAN SYNOPSIS Diabetic Ketoacidosis In setting of new diagnosis of diabetes and possible pancreatitis -Anion gap is resolved.  We will transition him to long-acting and sliding scale insulin.  He is eating well and tolerating diet. -  hemoglobin a1c 11.8 - follow blood cultures, Elevated Pro-Cal without leukocytosis fever or any other evidence of infection.  Chest radiograph not indicative of pneumonia and UA does not appear infected. No antibiotics needed.  Acute Kidney Injury - Pre-renal in etiology due to hypovolemia -resolved - Urinalysis unremarkable  Normal  renal u/s  ?Pancreatitis - Possible pancreatitis given elevated lipase and abdominal pain He has no abdominal pain or tenderness today and has been tolerating diet.  We will follow his lipase.  Best practice (evaluated daily)  Diet:diabetic Pain/Anxiety/Delirium protocol (if indicated):n/a VAP protocol (if indicated):n/a DVT prophylaxis:heparin GI prophylaxis:pantoprazole Glucose control:LA/SSI Mobility:no restrictions from today  Disposition:transfer to floor today if blood sugars remain okay after discontinuation of the insulin drip. Code Status:Full       Overall, patient is critically ill, prognosis is guarded.  Patient with Multiorgan failure and at high risk for cardiac arrest and death.   Rosine Door, MD  2020/08/05 11:26 AM [facility]

## 2020-07-17 NOTE — Progress Notes (Signed)
PHARMACY CONSULT NOTE  Pharmacy Consult for Electrolyte Monitoring and Replacement   Recent Labs: Potassium (mmol/L)  Date Value  07/17/2020 3.2 (L)   Magnesium (mg/dL)  Date Value  45/08/8880 2.0   Calcium (mg/dL)  Date Value  80/08/4915 9.1   Albumin (g/dL)  Date Value  91/50/5697 5.2 (H)   Phosphorus (mg/dL)  Date Value  94/80/1655 1.1 (L)   Sodium (mmol/L)  Date Value  07/17/2020 139    Assessment: 30 y.o. male who presents to the ED for evaluation of abdominal pain and emesis / possible pancreatitis given elevated lipase and abdominal pain.  Gluc: 468>206>>139 K: 2.8>2.9>3.2 (1608) Ca: 8.7>9.1 Phos: 1.1 Mg: 2.0  Goal of Therapy:  Electrolytes WNL  Plan:  K: inc'd 2.9>3.2 after KCL IV x1 and PO x1. Now ordered an additional KCL PO x2 and KCL IV 10x3. Ca: 8.7>9.1 (WNL) Phos: 1.1 continue K Phos Neutral 500 mg x 3 (each 500 mg dose contains 16 mmol phosphorous and 2.2 mEq potassium) Mg: 2.0 (WNL)   f/u electrolyte labs (BMP currently ordered q4h)  Martyn Malay, PharmD 07/17/2020 5:54 PM

## 2020-07-17 NOTE — Progress Notes (Signed)
PHARMACY CONSULT NOTE  Pharmacy Consult for Electrolyte Monitoring and Replacement   Recent Labs: Potassium (mmol/L)  Date Value  07/17/2020 2.9 (L)   Magnesium (mg/dL)  Date Value  16/03/9603 2.0   Calcium (mg/dL)  Date Value  54/02/8118 8.7 (L)   Albumin (g/dL)  Date Value  14/78/2956 5.2 (H)   Phosphorus (mg/dL)  Date Value  21/30/8657 1.1 (L)   Sodium (mmol/L)  Date Value  07/17/2020 136    Assessment: 30 y.o. male who presents to the ED for evaluation of abdominal pain and emesis / possible pancreatitis given elevated lipase and abdominal pain  Goal of Therapy:  Electrolytes WNL  Plan:   4 runs of KCl 10 mEq IV per Dr Allena Napoleon  KCL PO BID per Dr Allena Napoleon  K Phos Neutral 500 mg x 3 (each 500 mg dose contains 16 mmol phosphorous and 2.2 mEq potassium)  f/u electrolyte labs (BMP currently ordered q4h)  Lowella Bandy, PharmD 07/17/2020 2:42 PM

## 2020-07-18 LAB — PHOSPHORUS: Phosphorus: 2.5 mg/dL (ref 2.5–4.6)

## 2020-07-18 LAB — CBC
HCT: 35.2 % — ABNORMAL LOW (ref 39.0–52.0)
Hemoglobin: 12.5 g/dL — ABNORMAL LOW (ref 13.0–17.0)
MCH: 31.4 pg (ref 26.0–34.0)
MCHC: 35.5 g/dL (ref 30.0–36.0)
MCV: 88.4 fL (ref 80.0–100.0)
Platelets: 157 10*3/uL (ref 150–400)
RBC: 3.98 MIL/uL — ABNORMAL LOW (ref 4.22–5.81)
RDW: 12.6 % (ref 11.5–15.5)
WBC: 5.3 10*3/uL (ref 4.0–10.5)
nRBC: 0 % (ref 0.0–0.2)

## 2020-07-18 LAB — BASIC METABOLIC PANEL
Anion gap: 14 (ref 5–15)
BUN: 7 mg/dL (ref 6–20)
CO2: 24 mmol/L (ref 22–32)
Calcium: 8.7 mg/dL — ABNORMAL LOW (ref 8.9–10.3)
Chloride: 99 mmol/L (ref 98–111)
Creatinine, Ser: 0.93 mg/dL (ref 0.61–1.24)
GFR, Estimated: >60 mL/min (ref >60)
Glucose, Bld: 234 mg/dL — ABNORMAL HIGH (ref 70–99)
Potassium: 3.3 mmol/L — ABNORMAL LOW (ref 3.5–5.1)
Sodium: 137 mmol/L (ref 135–145)

## 2020-07-18 LAB — MAGNESIUM: Magnesium: 1.6 mg/dL — ABNORMAL LOW (ref 1.7–2.4)

## 2020-07-18 LAB — GLUCOSE, CAPILLARY
Glucose-Capillary: 222 mg/dL — ABNORMAL HIGH (ref 70–99)
Glucose-Capillary: 210 mg/dL — ABNORMAL HIGH (ref 70–99)
Glucose-Capillary: 215 mg/dL — ABNORMAL HIGH (ref 70–99)
Glucose-Capillary: 249 mg/dL — ABNORMAL HIGH (ref 70–99)

## 2020-07-18 LAB — LIPASE, BLOOD: Lipase: 71 U/L — ABNORMAL HIGH (ref 11–51)

## 2020-07-18 MED ORDER — PANTOPRAZOLE SODIUM 40 MG PO TBEC
40.0000 mg | DELAYED_RELEASE_TABLET | Freq: Every day | ORAL | Status: DC
  Administered 2020-07-18 – 2020-07-19 (×2): 40 mg via ORAL
  Filled 2020-07-18 (×2): qty 1

## 2020-07-18 MED ORDER — MAGNESIUM SULFATE 2 GM/50ML IV SOLN
2.0000 g | Freq: Once | INTRAVENOUS | Status: AC
  Administered 2020-07-18: 2 g via INTRAVENOUS
  Filled 2020-07-18: qty 50

## 2020-07-18 MED ORDER — ADULT MULTIVITAMIN W/MINERALS CH
1.0000 | ORAL_TABLET | Freq: Every day | ORAL | Status: DC
  Administered 2020-07-19: 1 via ORAL
  Filled 2020-07-18: qty 1

## 2020-07-18 MED ORDER — ENSURE MAX PROTEIN PO LIQD
11.0000 [oz_av] | Freq: Two times a day (BID) | ORAL | Status: DC
  Administered 2020-07-18 – 2020-07-19 (×2): 11 [oz_av] via ORAL
  Filled 2020-07-18: qty 330

## 2020-07-18 MED ORDER — POTASSIUM CHLORIDE 20 MEQ PO PACK
40.0000 meq | PACK | Freq: Once | ORAL | Status: AC
  Administered 2020-07-18: 40 meq via ORAL
  Filled 2020-07-18: qty 2

## 2020-07-18 NOTE — Plan of Care (Signed)
Continue with Plan of Care

## 2020-07-18 NOTE — Progress Notes (Signed)
PROGRESS NOTE    ADEN SEK  TYO:[id number] DOB: Sep 16, 1990 DOA: 07/15/2020 PCP: Patient, No Pcp Per   Brief Narrative:  HPI per Dr. Mamie Nick on 07/15/20 30 year old male presented to [facility] on 07/15/20 with two weeks of progressive symptoms which started with nausea and vomiting then abdominal pain and generalized weakness. He was not able to keep much food or drink down over recent days. He was found down, conscious, on the ground by his girlfriend overnight and then he was brought into the ER.   VBG shows pH 6.96, blood sugar was 494-793 on arrival and beta hydroxybutyrate >8.   He denies fevers, chills or sweats. He currently has abdominal pain. He denies diarrhea. No chest pain, lower extremity edema or swelling. He smokes cigarettes and marijuana, Denies other drug use.   PCCM was called to admit the patient for diabetic ketoacidosis   **Interim History  Patient is doing much better today and no complaint of any abdominal pain.  Will need his medications set up prior to discharge and TOC has been consulted given that he has no insurance.  Anticipate discharge in next 24 to 48 hours.  Assessment & Plan:   Active Problems:   Diabetic ketoacidosis (HCC)   Acute renal failure (ARF) (HCC)   Dehydration  Diabetic Ketoacidosis Uncontrolled Diabetes Mellitus Type 2 -In setting of new diagnosis of diabetes and possible pancreatitis -On Admission Blood Glucose was 227, Beta-Hydroxybutyric Acid was 6.85 (Trended down to 4.86) and HbA1c was 11.8 -Anion gap is resolved.  We will transition him to long-acting and sliding scale insulin.  He is eating well and tolerating diet. -He received subcu Lantus yesterday 18 units and this was somehow discontinued.  We have reinitiated this today.  We will need to speak with diabetes education coordinator prior to safe discharge disposition as we need to figure out his medication regimen for home.  We have also consulted TOC for medication  assistance given that he is uninsured and we will need to make sure he is affordable and proper regimen for discharge given his elevated hemoglobin A1c -TOC has been consulted and diabetes education coordinator need to do further teaching.  If able we can send him home with flex pens however he may need to go at 70/30 if that is the cheapest option.  Other option may be the clinic at [facility] in [place] clinic tomorrow; Will need TOC to set up PCP appointment and get the patient affordable regimen -Followblood cultures and showed NGTD at 3 Days, -Elevated Pro-Cal at 2.67 without Leukocytosis fever or any other evidence of infection. -Chest radiograph not indicative of pneumonia and UA does not appear infected -No antibiotics needed at this time -CBGs ranging from 139-222  Acute Kidney Injury -Pre-renal in etiology due to hypovolemia -resolved as Patient's BUN/Cr went from 20/1.97 -> 22/1.41 -> 7/0.93 -Urinalysis unremarkableand showed a hazy appearance with straw-colored urine, with greater than 500 glucose, moderate hemoglobin, 80 ketones, 30 protein, rare bacteria and 0-5 squamous epithelial cells and 0-5 WBCs -Renal U/S showed "Normal appearing kidneys bilaterally. Urinary bladder empty at this time." -Avoid nephrotoxic medications, contrast dyes, hypotension, and renally dose medications -Continue monitor and trend renal function carefully and repeat CMP in a.m.  ?Pancreatitis - Possible pancreatitis given elevated lipase and abdominal pain -He has no abdominal pain or tenderness today and has been tolerating diet.  We will follow his lipase -Paitent's patient went from 40 to and trended up to 386 is now 71.  Hypokalemia -Patient's K+ this AM was 3.3 -Replete with po KCl 40 mEQ po x1 today and with IV KCl 40 mEQ yesterday  -Repleting Mag as Below -Continue to Monitor and Replete as Necessary -Repeat CMP in the AM    Hypomagnesemia -Patient's Mag Level this AM was 1.6 -Replete with IV  Mag Sulfate 2 grams x2 -Continue to Monitor and Replete as Necessary -Repeat Mag Level in the AM   Normocytic Anemia -Pateint's Hgb/Hct this AM is 12.5/35.2 -Check Anemia Panel in the AM -Continue to Monitor and Replete as Necessary -Repeat CBC in the AM  Morbid Obesity -Complicates overall prognosis and Care -Estimated body mass index is 48.11 kg/m as calculated from the following:   Height as of this encounter: 5' 6"  (1.676 m).   Weight as of this encounter: 135.2 kg. -Weight Loss and Dietary Counseling given   DVT prophylaxis: Heparin 5,000 units sq q8h Code Status: FULL CODE  Family Communication: No family present at bedside Disposition Plan: (specify when and where you expect patient to be discharged). Include barriers to DC in this tab.  Status is: Inpatient  Remains inpatient appropriate because:Unsafe d/c plan, IV treatments appropriate due to intensity of illness or inability to take PO and Inpatient level of care appropriate due to severity of illness   Dispo: The patient is from: Home              Anticipated d/c is to: Home              Anticipated d/c date is: 1-2 days              Patient currently is not medically stable to d/c.   Difficult to place patient No  Consultants:  PCCM Transfer   Procedures: None  Antimicrobials:  Anti-infectives (From admission, onward)   None        Subjective: Seen and examined at bedside and he had no complaints and states his abdominal pain has improved.  Tolerated his diet today.  No nausea or vomiting.  Blood sugars are better controlled.  Unfortunately his insulin fell off his MAR so we will resume it today.  We have consulted TOC to assist [place] with his medications given his lack of insurance as he needs to have medications given his uncontrolled diabetes mellitus type 2 for safe discharge disposition.  Objective: Vitals:   07/17/20 2108 07/17/20 2309 07/18/20 0416 07/18/20 0726  BP: 140/88 136/73 126/78 127/78   Pulse: 72 67 68 70  Resp: 18 18 16  (!) 21  Temp: 98.8 F (37.1 C) 98.5 F (36.9 C) 98.6 F (37 C) 98.9 F (37.2 C)  TempSrc: Oral Oral Oral Oral  SpO2: 100% 99% 100% 99%  Weight:      Height:        Intake/Output Summary (Last 24 hours) at 07/18/2020 0739 Last data filed at 07/17/2020 1932 Gross per 24 hour  Intake 1043.35 ml  Output 1250 ml  Net -206.65 ml   Filed Weights   07/15/20 1940 07/16/20 0613 07/17/20 0500  Weight: 133.6 kg 135.2 kg 135.2 kg   Examination: Physical Exam:  Constitutional: WN/WD morbidly obese Male in NAD appears calm and comfortable ENMT: External Ears, Nose appear normal. Grossly normal hearing.  Neck: Appears normal, supple, no cervical masses, normal ROM, no appreciable thyromegaly; no JVD Respiratory: Diminished to auscultation bilaterally, no wheezing, rales, rhonchi or crackles. Normal respiratory effort and patient is not tachypenic. No accessory muscle use. Unlabored breathing  Cardiovascular: RRR, no  murmurs / rubs / gallops. S1 and S2 auscultated. Trace edema Abdomen: Soft, non-tender, Distended 2/2 to body habitus. Bowel sounds positive.  GU: Deferred. Musculoskeletal: No clubbing / cyanosis of digits/nails. No joint deformity upper and lower extremities.  Skin: No rashes, lesions, ulcers on a limited skin evaluation but has a tattoo on his Left hand. No induration; Warm and dry.  Neurologic: CN 2-12 grossly intact with no focal deficits. Romberg sign cerebellar reflexes not assessed.  Psychiatric: Normal judgment and insight. Alert and oriented x 3. Normal mood and appropriate affect.   Data Reviewed: I have personally reviewed following labs and imaging studies  CBC: Recent Labs  Lab 07/15/20 0047 07/15/20 0927 07/16/20 0743 07/17/20 0817 07/18/20 0521  WBC 10.6* 23.3* 10.9* 6.6 5.3  HGB 17.5* 17.3* 13.4 12.1* 12.5*  HCT 52.0 52.2* 37.4* 34.6* 35.2*  MCV 92.9 94.4 87.6 88.0 88.4  PLT 261 262 162 145* 820   Basic Metabolic  Panel: Recent Labs  Lab 07/15/20 2324 07/16/20 0743 07/16/20 1442 07/17/20 0817 07/17/20 1242 07/17/20 1608 07/17/20 2230 07/18/20 0521  NA 136 135  136   < > 134* 136 139 137 137  K 3.8 3.1*  3.3*   < > 2.8* 2.9* 3.2* 3.5 3.3*  CL 101 98  100   < > 100 102 103 101 99  CO2 16* 19*  18*   < > 21* 23 22 21* 24  GLUCOSE 222* 171*  163*   < > 180* 196* 147* 196* 234*  BUN 20 15  15    < > 10 10 9 9 7   CREATININE 1.32* 1.21  1.21   < > 0.90 0.90 0.81 0.91 0.93  CALCIUM 9.1 9.0  9.0   < > 8.6* 8.7* 9.1 8.7* 8.7*  MG 2.0 2.0  --  2.0  --   --   --  1.6*  PHOS 1.6* 1.2*  --  1.1*  --   --   --  2.5   < > = values in this interval not displayed.   GFR: Estimated Creatinine Clearance: 153.2 mL/min (by C-G formula based on SCr of 0.93 mg/dL). Liver Function Tests: Recent Labs  Lab 07/15/20 0047 07/15/20 0927  AST 22 32  ALT 21 19  ALKPHOS 62 67  BILITOT 1.9* 1.9*  PROT 9.4* 9.3*  ALBUMIN 5.3* 5.2*   Recent Labs  Lab 07/15/20 0047 07/15/20 0927 07/17/20 1242 07/18/20 0521  LIPASE 42 386* 54* 71*   No results for input(s): AMMONIA in the last 168 hours. Coagulation Profile: No results for input(s): INR, PROTIME in the last 168 hours. Cardiac Enzymes: No results for input(s): CKTOTAL, CKMB, CKMBINDEX, TROPONINI in the last 168 hours. BNP (last 3 results) No results for input(s): PROBNP in the last 8760 hours. HbA1C: Recent Labs    07/15/20 2059  HGBA1C 11.8*   CBG: Recent Labs  Lab 07/17/20 1312 07/17/20 1406 07/17/20 1609 07/17/20 1919 07/17/20 2109  GLUCAP 205* 170* 139* 167* 175*   Lipid Profile: Recent Labs    07/15/20 1442  TRIG 180*   Thyroid Function Tests: No results for input(s): TSH, T4TOTAL, FREET4, T3FREE, THYROIDAB in the last 72 hours. Anemia Panel: No results for input(s): VITAMINB12, FOLATE, FERRITIN, TIBC, IRON, RETICCTPCT in the last 72 hours. Sepsis Labs: Recent Labs  Lab 07/15/20 1442  PROCALCITON 2.67    Recent Results  (from the past 240 hour(s))  SARS Coronavirus 2 by RT PCR (hospital order, performed in [facility] hospital lab)  Nasopharyngeal Nasopharyngeal Swab     Status: None   Collection Time: 07/15/20  9:54 AM   Specimen: Nasopharyngeal Swab  Result Value Ref Range Status   SARS Coronavirus 2 NEGATIVE NEGATIVE Final    Comment: (NOTE) SARS-CoV-2 target nucleic acids are NOT DETECTED.  The SARS-CoV-2 RNA is generally detectable in upper and lower respiratory specimens during the acute phase of infection. The lowest concentration of SARS-CoV-2 viral copies this assay can detect is 250 copies / mL. A negative result does not preclude SARS-CoV-2 infection and should not be used as the sole basis for treatment or other patient management decisions.  A negative result may occur with improper specimen collection / handling, submission of specimen other than nasopharyngeal swab, presence of viral mutation(s) within the areas targeted by this assay, and inadequate number of viral copies (<250 copies / mL). A negative result must be combined with clinical observations, patient history, and epidemiological information.  Fact Sheet for Patients:   StrictlyIdeas.no  Fact Sheet for Healthcare Providers: BankingDealers.co.za  This test is not yet approved or  cleared by the [place] FDA and has been authorized for detection and/or diagnosis of SARS-CoV-2 by FDA under an Emergency Use Authorization (EUA).  This EUA will remain in effect (meaning this test can be used) for the duration of the COVID-19 declaration under Section 564(b)(1) of the Act, 21 U.S.C. section 360bbb-3(b)(1), unless the authorization is terminated or revoked sooner.  Performed at [facility], [address] [place], [place] [address]   Culture, blood (Routine X 2) w Reflex to ID Panel     Status: None (Preliminary result)   Collection Time: 07/15/20  8:59 PM    Specimen: BLOOD  Result Value Ref Range Status   Specimen Description BLOOD RIGHT HAND  Final   Special Requests   Final    BOTTLES DRAWN AEROBIC AND ANAEROBIC Blood Culture adequate volume   Culture   Final    NO GROWTH 3 DAYS Performed at [facility], [address] [place], [place] [address]    Report Status PENDING  Incomplete  Culture, blood (Routine X 2) w Reflex to ID Panel     Status: None (Preliminary result)   Collection Time: 07/15/20  8:59 PM   Specimen: BLOOD  Result Value Ref Range Status   Specimen Description BLOOD LEFT HAND  Final   Special Requests   Final    BOTTLES DRAWN AEROBIC AND ANAEROBIC Blood Culture adequate volume   Culture   Final    NO GROWTH 3 DAYS Performed at [facility], [address] [place], [place] [address]    Report Status PENDING  Incomplete  MRSA PCR Screening     Status: None   Collection Time: 07/15/20 10:03 PM   Specimen: Nasopharyngeal  Result Value Ref Range Status   MRSA by PCR NEGATIVE NEGATIVE Final    Comment:        The GeneXpert MRSA Assay (FDA approved for NASAL specimens only), is one component of a comprehensive MRSA colonization surveillance program. It is not intended to diagnose MRSA infection nor to guide or monitor treatment for MRSA infections. Performed at [facility], [address] [place], [place] [address]      RN Pressure Injury Documentation:     Estimated body mass index is 48.11 kg/m as calculated from the following:   Height as of this encounter: 5' 6"  (1.676 m).   Weight as of this encounter: 135.2 kg.  Malnutrition  Type:   Malnutrition Characteristics:   Nutrition Interventions:   Radiology Studies: US RENAL  Result Date: 07/16/2020 CLINICAL DATA:  Acute renal failure EXAM: RENAL ULTRASOUND COMPARISON:  None. FINDINGS: Right Kidney: Renal measurements: 11.8 x 5.7 x 5.3 cm = volume: 187 mL. Echogenicity and renal cortical thickness within normal  limits. No mass, perinephric fluid, or hydronephrosis visualized. No sonographically demonstrable calculus or ureterectasis Left Kidney: Renal measurements: 10.4 x 6.4 x 6.8 cm = volume: 237 mL. Echogenicity and renal cortical thickness within normal limits. No mass, perinephric fluid, or hydronephrosis visualized. No sonographically demonstrable calculus or ureterectasis. Bladder: Empty and cannot be assessed. Other: None. IMPRESSION: Normal appearing kidneys bilaterally. Urinary bladder empty at this time. Electronically Signed   By: Lowella Grip III M.D.   On: 07/16/2020 09:04    Scheduled Meds: . Chlorhexidine Gluconate Cloth  6 each Topical Daily  . heparin  5,000 Units Subcutaneous Q8H  . insulin aspart  0-15 Units Subcutaneous TID AC & HS  . insulin starter kit- pen needles  1 kit Other Once  . living well with diabetes book   Does not apply Once  . pantoprazole  40 mg Oral Daily  . potassium chloride  40 mEq Oral Once  . sodium chloride flush  10-40 mL Intracatheter Q12H   Continuous Infusions: . magnesium sulfate bolus IVPB      LOS: 3 days   Kerney Elbe, DO [facility] PAGER is on [place]  If 7PM-7AM, please contact night-coverage [email or web address]

## 2020-07-18 NOTE — Plan of Care (Signed)
Nutrition Education Note   RD consulted for nutrition education regarding diabetes.   30 year old male, daily smoker with obesity who presents with new diabetes, DKA and possible pancreatitis  Lab Results  Component Value Date   HGBA1C 11.8 (H) 07/15/2020    RD provided "Nutrition and Type II Diabetes" handout from the Academy of Nutrition and Dietetics via mail. Discussed different food groups and their effects on blood sugar, emphasizing carbohydrate-containing foods. Provided list of carbohydrates and recommended serving sizes of common foods.  Discussed importance of controlled and consistent carbohydrate intake throughout the day. Provided examples of ways to balance meals/snacks and encouraged intake of high-fiber, whole grain complex carbohydrates. Teach back method used.  Expect poor compliance.  Body mass index is 48.11 kg/m. Pt meets criteria for morbid obesity based on current BMI.  RD following this pt  Betsey Holiday MS, RD, LDN Please refer to [facility] for RD and/or RD on-call/weekend/after hours pager

## 2020-07-18 NOTE — Progress Notes (Signed)
PHARMACY CONSULT NOTE  Pharmacy Consult for Electrolyte Monitoring and Replacement   Recent Labs: Potassium (mmol/L)  Date Value  07/18/2020 3.3 (L)   Magnesium (mg/dL)  Date Value  45/08/8880 1.6 (L)   Calcium (mg/dL)  Date Value  80/08/4915 8.7 (L)   Albumin (g/dL)  Date Value  91/50/5697 5.2 (H)   Phosphorus (mg/dL)  Date Value  94/80/1655 2.5   Sodium (mmol/L)  Date Value  07/18/2020 137    Assessment: 30 y.o. male who presents to the ED for evaluation of abdominal pain and emesis / possible pancreatitis given elevated lipase and abdominal pain. She is now transferred to [facility] and transitioned off of insulin drip.  Goal of Therapy:  Electrolytes WNL  Plan:   2 grams IV magnesium sulfate x 1  40 mEq oral KCl x 1  f/u electrolyte labs in am  Lowella Bandy, PharmD 07/18/2020 6:56 AM

## 2020-07-18 NOTE — Progress Notes (Signed)
Initial Nutrition Assessment  DOCUMENTATION CODES:   Morbid obesity  INTERVENTION:   Ensure Max protein supplement BID, each supplement provides 150kcal and 30g of protein.  MVI po daily   Pt at high refeed risk; recommend monitor potassium, magnesium and phosphorus labs daily until stable  Diabetes diet education   NUTRITION DIAGNOSIS:   Inadequate oral intake related to acute illness as evidenced by per patient/family report.  GOAL:   Patient will meet greater than or equal to 90% of their needs  MONITOR:   PO intake,Supplement acceptance,Labs,Weight trends,Skin,I & O's  REASON FOR ASSESSMENT:   Consult Diet education  ASSESSMENT:   30 year old male, daily smoker with obesity who presents with new DM, DKA and possible pancreatitis   RD working remotely.  Spoke with pt via phone. Pt reports poor appetite and oral intake for 1 week pta r/t nausea and vomiting. Pt reports that his appetite and oral intake is improving in hospital; pt reports eating 100% of his breakfast this morning. RD discussed with pt the importance of adequate nutrition needed to preserve lean muscle. Pt is willing to drink chocolate supplements in hospital. RD will add supplements and MVI to help pt meet his estimated needs. Pt is unsure if he has had any weight changes.   RD provided pt with diabetes diet education today.   Medications reviewed and include: heparin, insulin, protonix, Mg sulfate  Labs reviewed: K 3.3(L), P 2.5 wnl, Mg 1.6(L) cbgs- 170, 139, 167, 175, 222 x 24 hrs AIC 11.8(H)- 1/20  NUTRITION - FOCUSED PHYSICAL EXAM: Unable to perform at this time  Diet Order:   Diet Order            Diet Carb Modified Fluid consistency: Thin; Room service appropriate? Yes  Diet effective now                EDUCATION NEEDS:   Education needs have been addressed  Skin:  Skin Assessment: Reviewed RN Assessment (ecchymosis)  Last BM:  1/22  Height:   Ht Readings from Last 1  Encounters:  07/15/20 5\' 6"  (1.676 m)    Weight:   Wt Readings from Last 1 Encounters:  07/17/20 135.2 kg    Ideal Body Weight:  64.5 kg  BMI:  Body mass index is 48.11 kg/m.  Estimated Nutritional Needs:   Kcal:  2700-3000kcal/day  Protein:  >135g/day  Fluid:  2.0-2.3L/day  07/19/20 MS, RD, LDN Please refer to [facility] for RD and/or RD on-call/weekend/after hours pager

## 2020-07-18 NOTE — Progress Notes (Signed)
Patient educated about being a high fall risk and needing the bed alarm.  He acknowledged but refused the alarm.

## 2020-07-19 ENCOUNTER — Other Ambulatory Visit: Payer: Self-pay | Admitting: Internal Medicine

## 2020-07-19 DIAGNOSIS — E1110 Type 2 diabetes mellitus with ketoacidosis without coma: Principal | ICD-10-CM

## 2020-07-19 DIAGNOSIS — R030 Elevated blood-pressure reading, without diagnosis of hypertension: Secondary | ICD-10-CM | POA: Diagnosis present

## 2020-07-19 DIAGNOSIS — E119 Type 2 diabetes mellitus without complications: Secondary | ICD-10-CM

## 2020-07-19 LAB — COMPREHENSIVE METABOLIC PANEL
ALT: 25 U/L (ref 0–44)
AST: 35 U/L (ref 15–41)
Albumin: 3.6 g/dL (ref 3.5–5.0)
Alkaline Phosphatase: 45 U/L (ref 38–126)
Anion gap: 17 — ABNORMAL HIGH (ref 5–15)
BUN: 9 mg/dL (ref 6–20)
CO2: 22 mmol/L (ref 22–32)
Calcium: 9.2 mg/dL (ref 8.9–10.3)
Chloride: 101 mmol/L (ref 98–111)
Creatinine, Ser: 0.84 mg/dL (ref 0.61–1.24)
GFR, Estimated: >60 mL/min (ref >60)
Glucose, Bld: 235 mg/dL — ABNORMAL HIGH (ref 70–99)
Potassium: 3.5 mmol/L (ref 3.5–5.1)
Sodium: 140 mmol/L (ref 135–145)
Total Bilirubin: 1.3 mg/dL — ABNORMAL HIGH (ref 0.3–1.2)
Total Protein: 6.6 g/dL (ref 6.5–8.1)

## 2020-07-19 LAB — CBC WITH DIFFERENTIAL/PLATELET
Abs Immature Granulocytes: 0.03 10*3/uL (ref 0.00–0.07)
Basophils Absolute: 0 10*3/uL (ref 0.0–0.1)
Basophils Relative: 1 %
Eosinophils Absolute: 0.1 10*3/uL (ref 0.0–0.5)
Eosinophils Relative: 1 %
HCT: 40.4 % (ref 39.0–52.0)
Hemoglobin: 13.9 g/dL (ref 13.0–17.0)
Immature Granulocytes: 1 %
Lymphocytes Relative: 47 %
Lymphs Abs: 2.3 10*3/uL (ref 0.7–4.0)
MCH: 31.1 pg (ref 26.0–34.0)
MCHC: 34.4 g/dL (ref 30.0–36.0)
MCV: 90.4 fL (ref 80.0–100.0)
Monocytes Absolute: 0.5 10*3/uL (ref 0.1–1.0)
Monocytes Relative: 11 %
Neutro Abs: 1.9 10*3/uL (ref 1.7–7.7)
Neutrophils Relative %: 39 %
Platelets: 192 10*3/uL (ref 150–400)
RBC: 4.47 MIL/uL (ref 4.22–5.81)
RDW: 12.7 % (ref 11.5–15.5)
WBC: 4.9 10*3/uL (ref 4.0–10.5)
nRBC: 0 % (ref 0.0–0.2)

## 2020-07-19 LAB — GLUCOSE, CAPILLARY
Glucose-Capillary: 212 mg/dL — ABNORMAL HIGH (ref 70–99)
Glucose-Capillary: 229 mg/dL — ABNORMAL HIGH (ref 70–99)

## 2020-07-19 LAB — PHOSPHORUS: Phosphorus: 3.5 mg/dL (ref 2.5–4.6)

## 2020-07-19 LAB — MAGNESIUM: Magnesium: 2.1 mg/dL (ref 1.7–2.4)

## 2020-07-19 MED ORDER — INSULIN ASPART PROT & ASPART (70-30 MIX) 100 UNIT/ML ~~LOC~~ SUSP: 14.0000 [IU] | Freq: Two times a day (BID) | SUBCUTANEOUS | 11 refills | Status: DC

## 2020-07-19 MED ORDER — INSULIN ASPART PROT & ASPART (70-30 MIX) 100 UNIT/ML ~~LOC~~ SUSP: 14.0000 [IU] | Freq: Two times a day (BID) | SUBCUTANEOUS | Status: DC

## 2020-07-19 MED ORDER — INSULIN LISPRO PROT & LISPRO (75-25 MIX) 100 UNIT/ML KWIKPEN: 14.0000 [IU] | PEN_INJECTOR | Freq: Two times a day (BID) | SUBCUTANEOUS | 11 refills | Status: DC

## 2020-07-19 MED ORDER — POTASSIUM CHLORIDE 20 MEQ PO PACK
20.0000 meq | PACK | Freq: Once | ORAL | Status: AC
  Administered 2020-07-19: 20 meq via ORAL
  Filled 2020-07-19: qty 1

## 2020-07-19 MED ORDER — INSULIN PEN NEEDLE 32G X 4 MM MISC: 11 refills | Status: DC

## 2020-07-19 NOTE — Progress Notes (Signed)
Informed MD patient 70/30 insulin not delivered from pharmacy MD stated okay to proceed with giving short acting insulin to cover meal and continue with d.c

## 2020-07-19 NOTE — Discharge Summary (Signed)
Discharge Summary  Clayton Boyd XJO:[id number] DOB: 07/10/1990  PCP: Patient, No Pcp Per  Admit date: 07/15/2020 Discharge date: 07/19/2020  Time spent: 25 minutes  Recommendations for Outpatient Follow-up:  1. New medication: Insulin 75/25 KwikPen 14 units twice a day plus needles 2. Patient [facility]  Discharge Diagnoses:  Active Hospital Problems   Diagnosis Date Noted  . Diabetic ketoacidosis (HCC) 07/15/2020  . Elevated blood pressure reading   . New onset type 2 diabetes mellitus (HCC)   . Morbid obesity (HCC)   . Dehydration   . Acute renal failure (ARF) [facility])     Resolved Hospital Problems  No resolved problems to display.    Discharge Condition: Improved, being discharged home  Diet recommendation: Carb modified  Vitals:   07/19/20 1003 07/19/20 1114  BP: (!) 154/89 118/88  Pulse: 74 61  Resp: 18 16  Temp: 97.8 F (36.6 C) 98.2 F (36.8 C)  SpO2: 98% 100%    History of present illness:  30 year old male with past medical history of morbid obesity presented to the emergency room on 1/20 with 2 weeks of nausea/vomiting plus abdominal pain plus weakness and significant thirst and constant urination.  In the emergency room, VBG noted a pH of 6.96 blood sugar ranged from 494-793.  Patient felt to be in DKA and admitted to the hospitalist service.  New finding of diabetes in this patient.   Hospital Course:  Principal Problem:   Diabetic ketoacidosis (HCC) in patient with new onset type 2 diabetes mellitus: Patient put on insulin drip and IV fluids.  By 1/22, able to be weaned off of insulin drip.  CBGs required adjustments in by 1/24, doing better.  Diabetes educator recommended insulin 70/30 14 units twice a day.  Patient has no insurance.  Referred to the medication management transitions of care pharmacy.  They keep in stock 75/25 pens which she was given plus associated needles.  [facility] with a  PCP.  Active Problems:   Acute renal failure (ARF) (HCC)/AKI: Secondary to DKA.  Resolved with IV fluids.    Dehydration: Secondary to DKA, resolved with IV fluids.    Elevated blood pressure reading: Blood pressures elevated during hospitalization following fluid resuscitation in the 150s.  However, patient was not given any blood pressure medication.  Last blood pressure before discharge was 114/83.  This can be followed as outpatient and recommend starting Norvasc should patient's blood pressure stay elevated.  Advised to lose weight.     Morbid obesity (HCC): Meets criteria BMI greater than 40.  Tobacco abuse: Counseled.   Procedures:  None  Consultations:  None  Discharge Exam: BP 118/88 (BP Location: Left Arm)   Pulse 61   Temp 98.2 F (36.8 C) (Oral)   Resp 16   Ht 5\' 6"  (1.676 m)   Wt 135.2 kg   SpO2 100%   BMI 48.11 kg/m   General: Alert and oriented x3, no acute distress Cardiovascular: Regular rate and rhythm, S1-S2 Respiratory: Clear to auscultation bilaterally  Discharge Instructions You were cared for by a hospitalist during your hospital stay. If you have any questions about your discharge medications or the care you received while you were in the hospital after you are discharged, you can call the unit and asked to speak with the hospitalist on call if the hospitalist that took care of you is not available. Once you are discharged, your primary care physician will handle any further  medical issues. Please note that NO REFILLS for any discharge medications will be authorized once you are discharged, as it is imperative that you return to your primary care physician (or establish a relationship with a primary care physician if you do not have one) for your aftercare needs so that they can reassess your need for medications and monitor your lab values.  Discharge Instructions    Diet Carb Modified   Complete by: As directed    Increase activity slowly    Complete by: As directed      Allergies as of 07/19/2020   No Known Allergies     Medication List    TAKE these medications   Insulin Lispro Prot & Lispro (75-25) 100 UNIT/ML Kwikpen Commonly known as: HumaLOG Mix 75/25 KwikPen Inject 14 Units into the skin 2 (two) times daily.   Insulin Pen Needle 32G X 4 MM Misc Use as directed       No Known Allergies    The results of significant diagnostics from this hospitalization (including imaging, microbiology, ancillary and laboratory) are listed below for reference.    Significant Diagnostic Studies: US RENAL  Result Date: 07/16/2020 CLINICAL DATA:  Acute renal failure EXAM: RENAL ULTRASOUND COMPARISON:  None. FINDINGS: Right Kidney: Renal measurements: 11.8 x 5.7 x 5.3 cm = volume: 187 mL. Echogenicity and renal cortical thickness within normal limits. No mass, perinephric fluid, or hydronephrosis visualized. No sonographically demonstrable calculus or ureterectasis Left Kidney: Renal measurements: 10.4 x 6.4 x 6.8 cm = volume: 237 mL. Echogenicity and renal cortical thickness within normal limits. No mass, perinephric fluid, or hydronephrosis visualized. No sonographically demonstrable calculus or ureterectasis. Bladder: Empty and cannot be assessed. Other: None. IMPRESSION: Normal appearing kidneys bilaterally. Urinary bladder empty at this time. Electronically Signed   By: Bretta Bang III M.D.   On: 07/16/2020 09:04   DG Chest Portable 1 View  Result Date: 07/15/2020 CLINICAL DATA:  Shortness of breath. EXAM: PORTABLE CHEST 1 VIEW COMPARISON:  None. FINDINGS: The heart size and mediastinal contours are within normal limits. Both lungs are clear. No pneumothorax or pleural effusion is noted. The visualized skeletal structures are unremarkable. IMPRESSION: No active disease. Electronically Signed   By: Lupita Raider M.D.   On: 07/15/2020 10:19    Microbiology: Recent Results (from the past 240 hour(s))  SARS Coronavirus 2  by RT PCR (hospital order, performed in [facility] hospital lab) Nasopharyngeal Nasopharyngeal Swab     Status: None   Collection Time: 07/15/20  9:54 AM   Specimen: Nasopharyngeal Swab  Result Value Ref Range Status   SARS Coronavirus 2 NEGATIVE NEGATIVE Final    Comment: (NOTE) SARS-CoV-2 target nucleic acids are NOT DETECTED.  The SARS-CoV-2 RNA is generally detectable in upper and lower respiratory specimens during the acute phase of infection. The lowest concentration of SARS-CoV-2 viral copies this assay can detect is 250 copies / mL. A negative result does not preclude SARS-CoV-2 infection and should not be used as the sole basis for treatment or other patient management decisions.  A negative result may occur with improper specimen collection / handling, submission of specimen other than nasopharyngeal swab, presence of viral mutation(s) within the areas targeted by this assay, and inadequate number of viral copies (<250 copies / mL). A negative result must be combined with clinical observations, patient history, and epidemiological information.  Fact Sheet for Patients:   BoilerBrush.com.cy  Fact Sheet for Healthcare Providers: https://pope.com/  This test is not yet approved  or  cleared by the [place] FDA and has been authorized for detection and/or diagnosis of SARS-CoV-2 by FDA under an Emergency Use Authorization (EUA).  This EUA will remain in effect (meaning this test can be used) for the duration of the COVID-19 declaration under Section 564(b)(1) of the Act, 21 U.S.C. section 360bbb-3(b)(1), unless the authorization is terminated or revoked sooner.  Performed at [facility], [address] [place], [place] [address]   Culture, blood (Routine X 2) w Reflex to ID Panel     Status: None (Preliminary result)   Collection Time: 07/15/20  8:59 PM   Specimen: BLOOD  Result Value Ref Range Status    Specimen Description BLOOD RIGHT HAND  Final   Special Requests   Final    BOTTLES DRAWN AEROBIC AND ANAEROBIC Blood Culture adequate volume   Culture   Final    NO GROWTH 4 DAYS Performed at [facility], [address] [place], [place] [address]    Report Status PENDING  Incomplete  Culture, blood (Routine X 2) w Reflex to ID Panel     Status: None (Preliminary result)   Collection Time: 07/15/20  8:59 PM   Specimen: BLOOD  Result Value Ref Range Status   Specimen Description BLOOD LEFT HAND  Final   Special Requests   Final    BOTTLES DRAWN AEROBIC AND ANAEROBIC Blood Culture adequate volume   Culture   Final    NO GROWTH 4 DAYS Performed at [facility], [address] [place], [place] [address]    Report Status PENDING  Incomplete  MRSA PCR Screening     Status: None   Collection Time: 07/15/20 10:03 PM   Specimen: Nasopharyngeal  Result Value Ref Range Status   MRSA by PCR NEGATIVE NEGATIVE Final    Comment:        The GeneXpert MRSA Assay (FDA approved for NASAL specimens only), is one component of a comprehensive MRSA colonization surveillance program. It is not intended to diagnose MRSA infection nor to guide or monitor treatment for MRSA infections. Performed at [facility], [address] [place], [place] [address]      Labs: Basic Metabolic Panel: Recent Labs  Lab 07/15/20 2324 07/16/20 [id number] 07/16/20 1442 07/17/20 0817 07/17/20 1242 07/17/20 1608 07/17/20 2230 07/18/20 0521 07/19/20 0626  NA 136 135  136   < > 134* 136 139 137 137 140  K 3.8 3.1*  3.3*   < > 2.8* 2.9* 3.2* 3.5 3.3* 3.5  CL 101 98  100   < > 100 102 103 101 99 101  CO2 16* 19*  18*   < > 21* 23 22 21* 24 22  GLUCOSE 222* 171*  163*   < > 180* 196* 147* 196* 234* 235*  BUN 20 15  15    < > 10 10 9 9 7 9   CREATININE 1.32* 1.21  1.21   < > 0.90 0.90 0.81 0.91 0.93 0.84  CALCIUM 9.1 9.0  9.0   < > 8.6* 8.7* 9.1 8.7* 8.7* 9.2  MG 2.0 2.0  --   2.0  --   --   --  1.6* 2.1  PHOS 1.6* 1.2*  --  1.1*  --   --   --  2.5 3.5   < > = values in this interval not displayed.   Liver Function Tests: Recent Labs  Lab 07/15/20 0047 07/15/20 0927 07/19/20 0626  AST 22 32 35  ALT 21 19 25   ALKPHOS 62 67 45  BILITOT 1.9* 1.9* 1.3*  PROT 9.4* 9.3* 6.6  ALBUMIN 5.3* 5.2* 3.6   Recent Labs  Lab 07/15/20 0047 07/15/20 0927 07/17/20 1242 07/18/20 0521  LIPASE 42 386* 54* 71*   No results for input(s): AMMONIA in the last 168 hours. CBC: Recent Labs  Lab 07/15/20 0927 07/16/20 0743 07/17/20 0817 07/18/20 0521 07/19/20 0626  WBC 23.3* 10.9* 6.6 5.3 4.9  NEUTROABS  --   --   --   --  1.9  HGB 17.3* 13.4 12.1* 12.5* 13.9  HCT 52.2* 37.4* 34.6* 35.2* 40.4  MCV 94.4 87.6 88.0 88.4 90.4  PLT 262 162 145* 157 192   Cardiac Enzymes: No results for input(s): CKTOTAL, CKMB, CKMBINDEX, TROPONINI in the last 168 hours. BNP: BNP (last 3 results) No results for input(s): BNP in the last 8760 hours.  ProBNP (last 3 results) No results for input(s): PROBNP in the last 8760 hours.  CBG: Recent Labs  Lab 07/18/20 1206 07/18/20 1717 07/18/20 2131 07/19/20 0759 07/19/20 1115  GLUCAP 210* 215* 249* 212* 229*       Signed:  07/21/20, MD [facility] 07/19/2020, 12:50 PM

## 2020-07-19 NOTE — Progress Notes (Signed)
PHARMACY CONSULT NOTE  Pharmacy Consult for Electrolyte Monitoring and Replacement   Recent Labs: Potassium (mmol/L)  Date Value  07/19/2020 3.5   Magnesium (mg/dL)  Date Value  35/24/8185 2.1   Calcium (mg/dL)  Date Value  90/93/1121 9.2   Albumin (g/dL)  Date Value  62/44/6950 3.6   Phosphorus (mg/dL)  Date Value  72/25/7505 3.5   Sodium (mmol/L)  Date Value  07/19/2020 140    Assessment: 30 y.o. male who presents to the ED for evaluation of abdominal pain and emesis / possible pancreatitis given elevated lipase and abdominal pain. She is now transferred to [facility] and transitioned off of insulin drip.  Goal of Therapy:  Electrolytes WNL  Plan:   20 mEq oral KCl packet x 1  f/u electrolyte labs in am  Neli Fofana A, PharmD 07/19/2020 8:21 AM

## 2020-07-19 NOTE — TOC Initial Note (Addendum)
Transition of Care [facility]) - Initial/Assessment Note    Patient Details  Name: Clayton Boyd MRN: [id number] Date of Birth: 06/18/1991  Transition of [facility] CM/SW Contact:    Candie Chroman, LCSW Phone Number: 07/19/2020, 10:42 AM  Clinical Narrative: CSW met with patient. No supports at bedside. CSW introduced role and inquired about lack of PCP and insurance. Patient confirmed. Provided packet for free/low-cost healthcare in [facility] and intake paperwork for [employer]. Notified patient on plan to [facility] once discharged. Patient said he can pick up the medications once ready. Per diabetes coordinator note from 1/21, glucometer was provided for home use. No further concerns. CSW encouraged patient to contact CSW as needed. CSW will continue to follow patient for support and facilitate return home once discharged.            1:42 pm: Asked [facility]ist to notify CSW when prescription will be ready. Put address on a sticky note and put on front of discharge packet.     Expected Discharge Plan: Home/Self Care Barriers to Discharge: Continued Medical Work up   Patient Goals and CMS Choice        Expected Discharge Plan and Services Expected Discharge Plan: Home/Self Care       Living arrangements for the past 2 months: Single Family Home                                      Prior Living Arrangements/Services Living arrangements for the past 2 months: Single Family Home   Patient language and need for interpreter reviewed:: Yes Do you feel safe going back to the place where you live?: Yes      Need for Family Participation in Patient Care: Yes (Comment)     Criminal Activity/Legal Involvement Pertinent to Current Situation/Hospitalization: No - Comment as needed  Activities of Daily Living Home Assistive Devices/Equipment: None ADL Screening (condition at time of  admission) Patient's cognitive ability adequate to safely complete daily activities?: Yes Is the patient deaf or have difficulty hearing?: No Does the patient have difficulty seeing, even when wearing glasses/contacts?: No Does the patient have difficulty concentrating, remembering, or making decisions?: No Patient able to express need for assistance with ADLs?: Yes Does the patient have difficulty dressing or bathing?: Yes Independently performs ADLs?: Yes (appropriate for developmental age) Does the patient have difficulty walking or climbing stairs?: Yes Weakness of Legs: Both Weakness of Arms/Hands: None  Permission Sought/Granted                  Emotional Assessment Appearance:: Appears stated age Attitude/Demeanor/Rapport: Engaged,Gracious Affect (typically observed): Accepting,Appropriate,Calm,Pleasant Orientation: : Oriented to Self,Oriented to Place,Oriented to  Time,Oriented to Situation Alcohol / Substance Use: Not Applicable Psych Involvement: No (comment)  Admission diagnosis:  Acidosis [E87.2] Dehydration [E86.0] Diabetic ketoacidosis ([place]) [E11.10] Acute renal failure (ARF) (HCC) [N17.9] Diabetic ketoacidosis without coma associated with type 2 diabetes mellitus ([place]) [E11.10] Patient Active Problem List   Diagnosis Date Noted  . Dehydration   . Acute renal failure (ARF) ([place])   . Diabetic ketoacidosis ([place]) 07/15/2020   PCP:  Patient, No Pcp Per Pharmacy:  No Pharmacies Listed    Social Determinants of Health (SDOH) Interventions    Readmission Risk Interventions No flowsheet data found.

## 2020-07-19 NOTE — Progress Notes (Addendum)
Inpatient Diabetes Program Recommendations  AACE/ADA: New Consensus Statement on Inpatient Glycemic Control   Target Ranges:  Prepandial:   less than 140 mg/dL      Peak postprandial:   less than 180 mg/dL (1-2 hours)      Critically ill patients:  140 - 180 mg/dL   Results for NICANOR, MENDOLIA (MRN [id number]) as of 07/19/2020 08:11  Ref. Range 07/18/2020 07:34 07/18/2020 12:06 07/18/2020 17:17 07/18/2020 21:31  Glucose-Capillary Latest Ref Range: 70 - 99 mg/dL 213 (H) 086 (H) 578 (H) 249 (H)  Results for GRANTLAND, WANT (MRN [id number]) as of 07/19/2020 08:11  Ref. Range 07/15/2020 20:59  Hemoglobin A1C Latest Ref Range: 4.8 - 5.6 % 11.8 (H)   Review of Glycemic Control  Diabetes history:NO Outpatient Diabetes medications: NA Current orders for Inpatient glycemic control: Novolog 0-15 units TID with meals  Inpatient Diabetes Program Recommendations:    Insulin: Please consider ordering 70/30 14 units BID to start now.  NOTE: Patient with new DM dx and seen by inpatient diabetes coordinator on 07/16/20. Patient was given Lantus 18 units on 07/17/20 @ 13:12 and no basal insulin has been given since then. Glucose in 200's mg/dl over past 24 hours.  Request that patient be ordered 70/30 14 units BID to start now (dose would provide a total of 19.6 units for basal and 8.4 units for meal coverage per day).  Sent communication to [employer], RN to ask that patient be allowed to self administer insulin injections.  Diabetes coordinator will follow up with patient today.  Patient has no insurance and TOC consulted. Would recommend patient discharge on affordable insulin. [facility] and they have Humalog 75/25 insulin pens available. At time of discharge please provide Rx for: Humalog 75/25 Kwikpens [phone]) and insulin pen needles [phone]).  Addendum 07/19/20@11 :20-Spoke with patient at bedside. Patient reports that he has glucometer and testing supplies that were given to him on  Friday. Patient reports that he has given himself several insulin injections and he feels comfortable with self injecting. Reviewed diabetes management and patient is able to answer questions correctly. Patient plans [facility] and get medications [facility]. Encouraged patient to be sure to fill out application for [facility]. Patient confirms that he would like to use an insulin pen. Asked patient to demonstrate how to use the insulin pen (was shown on Friday) and patient stated he did not need to practice again with the insulin pen as he has used the insulin pen in the past when he gave his uncle insulin injections.  Discussed Novolog 70/30 insulin as it was ordered today, explained how it works and that it is typically taken with breakfast and supper each day. Informed patient that [facility] has Humalog Kwikpen 75/25 on hand so that would be prescribed at discharge. Encouraged patient to check glucose 3-4 times per day and asked that he keep a glucose log or be sure to take glucometer to follow up appointments.  Reviewed hypoglycemia along with treatment. Patient verbalized understanding of information and states that he has no questions at this time related to DM or insulin.  Thanks, Orlando Penner, RN, MSN, CDE Diabetes Coordinator Inpatient Diabetes Program [phone] (Team Pager from 8am to 5pm)

## 2020-07-20 LAB — CULTURE, BLOOD (ROUTINE X 2)
Culture: NO GROWTH
Culture: NO GROWTH
Special Requests: ADEQUATE
Special Requests: ADEQUATE

## 2020-10-25 ENCOUNTER — Telehealth: Payer: Self-pay | Admitting: Pharmacist

## 2020-10-25 NOTE — Telephone Encounter (Signed)
Patient failed to provide requested 2022 financial documentation. Unable to determine patient's eligibility status for [facility]. No additional medication assistance will be provided by [facility] without the required proof of income documentation. Patient notified by letter.  Vonda Henderson [facility] Administrative Assistant 

## 2021-06-06 ENCOUNTER — Ambulatory Visit
Admission: EM | Admit: 2021-06-06 | Discharge: 2021-06-06 | Disposition: A | Discharge status: Home / Self Care | Payer: Self-pay | Attending: Emergency Medicine | Admitting: Emergency Medicine

## 2021-06-06 ENCOUNTER — Other Ambulatory Visit: Payer: Self-pay

## 2021-06-06 ENCOUNTER — Encounter: Payer: Self-pay | Admitting: Emergency Medicine

## 2021-06-06 DIAGNOSIS — F17210 Nicotine dependence, cigarettes, uncomplicated: Secondary | ICD-10-CM | POA: Insufficient documentation

## 2021-06-06 DIAGNOSIS — R52 Pain, unspecified: Secondary | ICD-10-CM | POA: Insufficient documentation

## 2021-06-06 DIAGNOSIS — J069 Acute upper respiratory infection, unspecified: Secondary | ICD-10-CM | POA: Insufficient documentation

## 2021-06-06 DIAGNOSIS — U071 COVID-19: Secondary | ICD-10-CM | POA: Insufficient documentation

## 2021-06-06 DIAGNOSIS — R059 Cough, unspecified: Secondary | ICD-10-CM | POA: Insufficient documentation

## 2021-06-06 MED ORDER — PROMETHAZINE-DM 6.25-15 MG/5ML PO SYRP: 5.0000 mL | ORAL_SOLUTION | Freq: Four times a day (QID) | ORAL | 0 refills | Status: AC | PRN

## 2021-06-06 MED ORDER — IPRATROPIUM BROMIDE 0.06 % NA SOLN: 2.0000 | Freq: Four times a day (QID) | NASAL | 12 refills | Status: AC

## 2021-06-06 MED ORDER — BENZONATATE 100 MG PO CAPS: 200.0000 mg | ORAL_CAPSULE | Freq: Three times a day (TID) | ORAL | 0 refills | Status: AC

## 2021-06-06 NOTE — ED Provider Notes (Signed)
[facility]    CSN: [id number] Arrival date & time: 06/06/21  1314      History   Chief Complaint Chief Complaint  Patient presents with   Headache   Generalized Body Aches    HPI SLY PARLEE is a 30 y.o. male.   HPI  30 year old male here for evaluation of respiratory symptoms.  Patient is here with his girlfriend, who was diagnosed with COVID 3 days ago and should be in quarantine, here for evaluation of runny nose and nasal congestion for clear nasal discharge, headache, body aches, nonproductive cough, and nausea.  He denies any fever, sore throat, ear pain, shortness of breath or wheezing, vomiting, or diarrhea.  Past Medical History:  Diagnosis Date   Hypertension     Patient Active Problem List   Diagnosis Date Noted   Elevated blood pressure reading    New onset type 2 diabetes mellitus (HCC)    Morbid obesity (HCC)    Dehydration    Acute renal failure (ARF) (HCC)    Diabetic ketoacidosis (HCC) 07/15/2020    History reviewed. No pertinent surgical history.     Home Medications    Prior to Admission medications   Medication Sig Start Date End Date Taking? Authorizing Provider  benzonatate (TESSALON) 100 MG capsule Take 2 capsules (200 mg total) by mouth every 8 (eight) hours. 06/06/21  Yes Becky Augusta, NP  ipratropium (ATROVENT) 0.06 % nasal spray Place 2 sprays into both nostrils 4 (four) times daily. 06/06/21  Yes Becky Augusta, NP  promethazine-dextromethorphan (PROMETHAZINE-DM) 6.25-15 MG/5ML syrup Take 5 mLs by mouth 4 (four) times daily as needed. 06/06/21  Yes Becky Augusta, NP  Insulin Lispro Prot & Lispro (HUMALOG 75/25 MIX) (75-25) 100 UNIT/ML Kwikpen INJECT 14 UNITS UNDER THE SKIN 2 TIMES A DAY 07/19/20 07/19/21  Hollice Espy, MD  Insulin Pen Needle 32G X 4 MM MISC AS DIRECTED 07/19/20 07/19/21  Hollice Espy, MD    Family History History reviewed. No pertinent family history.  Social History Social History    Tobacco Use   Smoking status: Every Day    Packs/day: 0.50    Types: Cigarettes   Smokeless tobacco: Never  Vaping Use   Vaping Use: Never used  Substance Use Topics   Alcohol use: Yes    Comment: occ   Drug use: Not Currently     Allergies   Patient has no known allergies.   Review of Systems Review of Systems  Constitutional:  Negative for activity change, appetite change and fever.  HENT:  Positive for congestion and rhinorrhea. Negative for ear pain and sore throat.   Respiratory:  Positive for cough. Negative for shortness of breath and wheezing.   Gastrointestinal:  Positive for nausea. Negative for diarrhea and vomiting.  Musculoskeletal:  Positive for arthralgias and myalgias.  Skin:  Negative for rash.  Neurological:  Positive for headaches.  Hematological: Negative.   Psychiatric/Behavioral: Negative.      Physical Exam Triage Vital Signs ED Triage Vitals  Enc Vitals Group     BP 06/06/21 1012 (!) 146/89     Pulse Rate 06/06/21 1012 83     Resp 06/06/21 1012 18     Temp 06/06/21 1012 98.9 F (37.2 C)     Temp Source 06/06/21 1012 Oral     SpO2 06/06/21 1012 97 %     Weight --      Height --      Head Circumference --  Peak Flow --      Pain Score 06/06/21 1015 8     Pain Loc --      Pain Edu? --      Excl. in GC? --    No data found.  Updated Vital Signs BP (!) 146/89 (BP Location: Right Arm)   Pulse 83   Temp 98.9 F (37.2 C) (Oral)   Resp 18   SpO2 97%   Visual Acuity Right Eye Distance:   Left Eye Distance:   Bilateral Distance:    Right Eye Near:   Left Eye Near:    Bilateral Near:     Physical Exam Vitals and nursing note reviewed.  Constitutional:      General: He is not in acute distress.    Appearance: Normal appearance. He is not ill-appearing.  HENT:     Head: Normocephalic and atraumatic.     Right Ear: Tympanic membrane, ear canal and external ear normal. There is no impacted cerumen.     Left Ear: Tympanic  membrane, ear canal and external ear normal. There is no impacted cerumen.     Nose: Congestion and rhinorrhea present.     Mouth/Throat:     Mouth: Mucous membranes are moist.     Pharynx: Oropharynx is clear. Posterior oropharyngeal erythema present.  Cardiovascular:     Rate and Rhythm: Normal rate and regular rhythm.     Pulses: Normal pulses.     Heart sounds: Normal heart sounds. No murmur heard.   No gallop.  Pulmonary:     Effort: Pulmonary effort is normal.     Breath sounds: Normal breath sounds. No wheezing, rhonchi or rales.  Musculoskeletal:     Cervical back: Normal range of motion and neck supple.  Lymphadenopathy:     Cervical: No cervical adenopathy.  Skin:    General: Skin is warm and dry.     Capillary Refill: Capillary refill takes less than 2 seconds.     Findings: No erythema or rash.  Neurological:     General: No focal deficit present.     Mental Status: He is alert and oriented to person, place, and time.  Psychiatric:        Mood and Affect: Mood normal.        Behavior: Behavior normal.        Thought Content: Thought content normal.        Judgment: Judgment normal.     UC Treatments / Results  Labs (all labs ordered are listed, but only abnormal results are displayed) Labs Reviewed  SARS CORONAVIRUS 2 (TAT 6-24 HRS)    EKG   Radiology No results found.  Procedures Procedures (including critical care time)  Medications Ordered in UC Medications - No data to display  Initial Impression / Assessment and Plan / UC Course  I have reviewed the triage vital signs and the nursing notes.  Pertinent labs & imaging results that were available during my care of the patient were reviewed by me and considered in my medical decision making (see chart for details).  Patient is a nontoxic-appearing 30 year old male here for evaluation of COVID-like symptoms that began yesterday.  His girlfriend is currently COVID-positive and was diagnosed 3 days  ago.  She is with him in the room and neither patient were wearing their masks when this provider entered.  Patient is in no respiratory distress at all and he has able to speak in full sentences.  Physical exam reveals pearly gray  tympanic membranes bilaterally with normal light reflex and clear external auditory canals.  Nasal mucosa is erythematous and edematous with clear nasal discharge in both nares.  Oropharyngeal exam reveals mild posterior oropharyngeal erythema with clear postnasal drip.  No cervical lymphadenopathy appreciated on exam.  Cardiopulmonary exam reveals clear lung sounds in all fields.  I will swab the patient for COVID and discharged home to isolate pending the results.  If he is positive he will have to quarantine for 5 days from onset of symptoms.  He is to use over-the-counter Tylenol and ibuprofen as needed for body aches and any fever that may develop.  He did take one of his girlfriends Toradol prior to arrival and states that it did not help his body aches or headache.  I will give him Atrovent nasal spray to help with the nasal congestion, Tessalon Perles and Promethazine DM cough syrup help with cough and congestion.  If patient test positive we will treat him with molnupiravir as he is obese and also a smoker.  Patient is a type II diabetic as well.  Work note provided.   Final Clinical Impressions(s) / UC Diagnoses   Final diagnoses:  Viral URI with cough     Discharge Instructions      Isolate at home pending the results of your COVID test.  If you test positive then you will have to quarantine for 5 days from the start of your symptoms.  After 5 days you can break quarantine if your symptoms have improved and you have not had a fever for 24 hours without taking Tylenol or ibuprofen.  Use over-the-counter Tylenol and ibuprofen as needed for body aches and fever.  Use the Atrovent nasal spray, 2 squirts in each nostril every 6 hours, as needed for runny nose and  postnasal drip.  Use the Tessalon Perles every 8 hours during the day.  Take them with a small sip of water.  They may give you some numbness to the base of your tongue or a metallic taste in your mouth, this is normal.  Use the Promethazine DM cough syrup at bedtime for cough and congestion.  It will make you drowsy so do not take it during the day.  Return for reevaluation or see your primary care provider for any new or worsening symptoms.   If you develop any increased shortness of breath-especially at rest, you are unable to speak in full sentences, or is a late sign your lips are turning blue you need to go the ER for evaluation.      ED Prescriptions     Medication Sig Dispense Auth. Provider   ipratropium (ATROVENT) 0.06 % nasal spray Place 2 sprays into both nostrils 4 (four) times daily. 15 mL Becky Augusta, NP   benzonatate (TESSALON) 100 MG capsule Take 2 capsules (200 mg total) by mouth every 8 (eight) hours. 21 capsule Becky Augusta, NP   promethazine-dextromethorphan (PROMETHAZINE-DM) 6.25-15 MG/5ML syrup Take 5 mLs by mouth 4 (four) times daily as needed. 118 mL Becky Augusta, NP      PDMP not reviewed this encounter.   Becky Augusta, NP 06/06/21 1131

## 2021-06-06 NOTE — ED Notes (Signed)
Called in from parking lot  

## 2021-06-06 NOTE — Discharge Instructions (Signed)
Isolate at home pending the results of your COVID test.  If you test positive then you will have to quarantine for 5 days from the start of your symptoms.  After 5 days you can break quarantine if your symptoms have improved and you have not had a fever for 24 hours without taking Tylenol or ibuprofen. ° °Use over-the-counter Tylenol and ibuprofen as needed for body aches and fever. ° °Use the Atrovent nasal spray, 2 squirts in each nostril every 6 hours, as needed for runny nose and postnasal drip. ° °Use the Tessalon Perles every 8 hours during the day.  Take them with a small sip of water.  They may give you some numbness to the base of your tongue or a metallic taste in your mouth, this is normal. ° °Use the Promethazine DM cough syrup at bedtime for cough and congestion.  It will make you drowsy so do not take it during the day. ° °Return for reevaluation or see your primary care provider for any new or worsening symptoms.  ° °If you develop any increased shortness of breath-especially at rest, you are unable to speak in full sentences, or is a late sign your lips are turning blue you need to go the ER for evaluation.  °

## 2021-06-06 NOTE — ED Triage Notes (Addendum)
Pt presents today with nasal congestion, cough and body aches that began yesterday. He believes he has been exposed to Covid by girlfriend diagnosed on Friday. Girlfriend gave him a Toradol tab pta.

## 2021-06-07 ENCOUNTER — Telehealth (HOSPITAL_COMMUNITY): Payer: Self-pay | Admitting: Emergency Medicine

## 2021-06-07 LAB — SARS CORONAVIRUS 2 (TAT 6-24 HRS): SARS Coronavirus 2: POSITIVE — AB

## 2021-06-07 MED ORDER — NIRMATRELVIR/RITONAVIR (PAXLOVID)TABLET: 3.0000 | ORAL_TABLET | Freq: Two times a day (BID) | ORAL | 0 refills | Status: DC

## 2021-06-07 MED ORDER — NIRMATRELVIR/RITONAVIR (PAXLOVID)TABLET: 3.0000 | ORAL_TABLET | Freq: Two times a day (BID) | ORAL | 0 refills | Status: AC

## 2021-06-07 NOTE — Telephone Encounter (Signed)
PAtient requested antivirals, okay'd by Riki Rusk, APP.

## 2022-03-08 IMAGING — US US RENAL
1 series · 14 of 25 positions shown · non-contrast
Comparison: None.

CLINICAL DATA: Acute renal failure

EXAM:
RENAL ULTRASOUND

[Series 1: us renal · 14 of 31 slices shown]
[im 1/31]
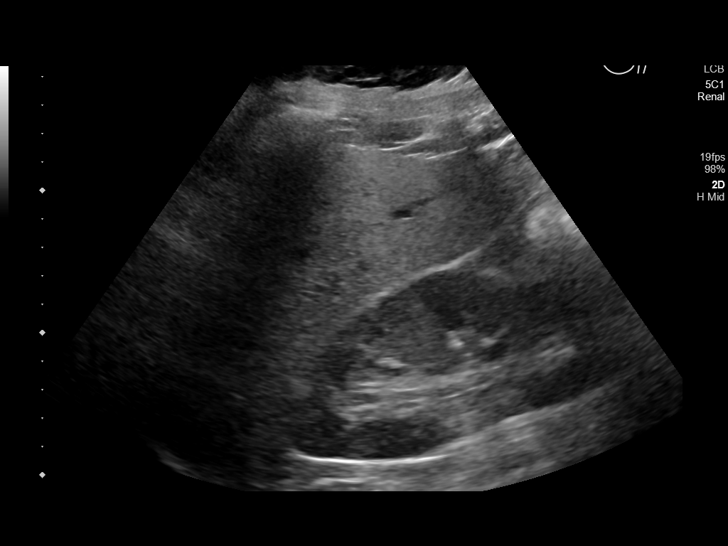
[im 3/31]
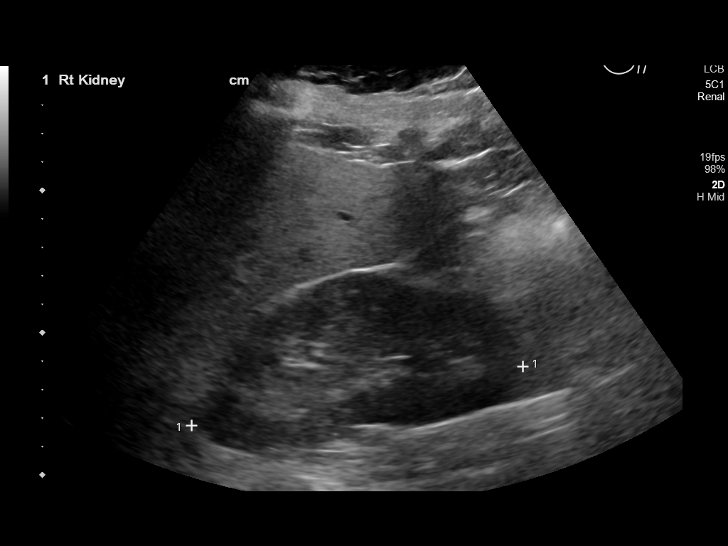
[im 6/31]
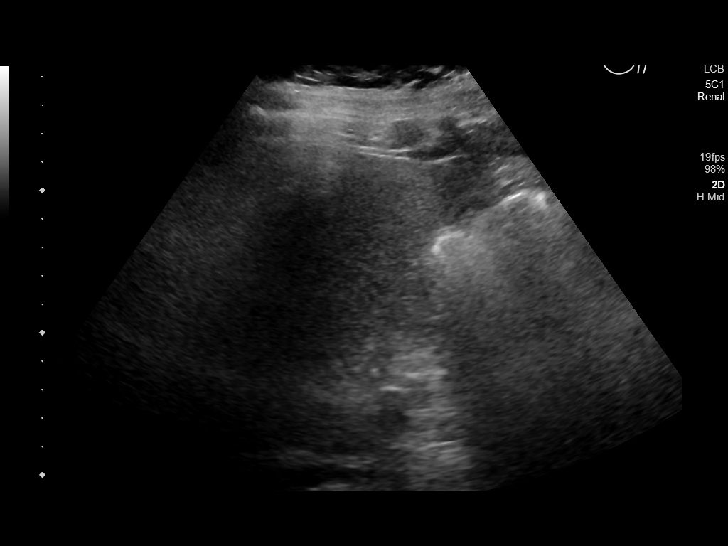
[im 8/31]
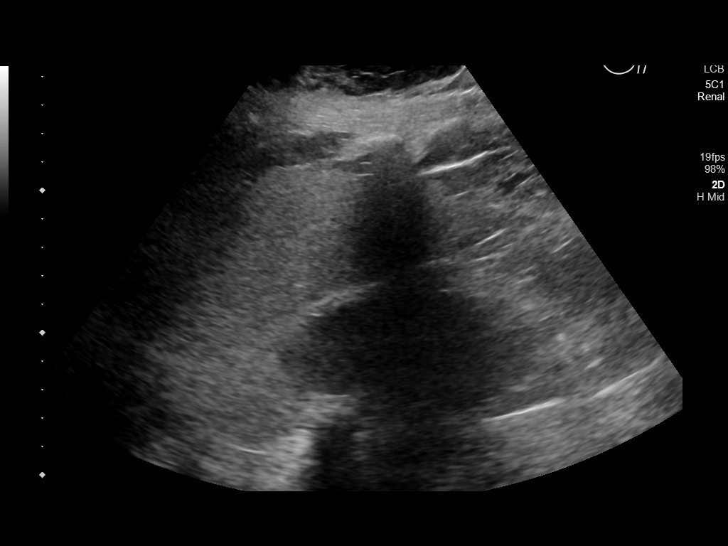
[im 11/31]
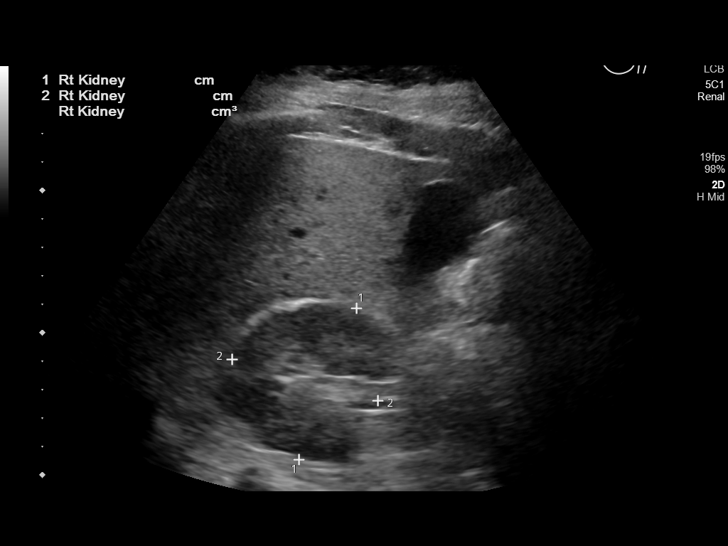
[im 12/31]
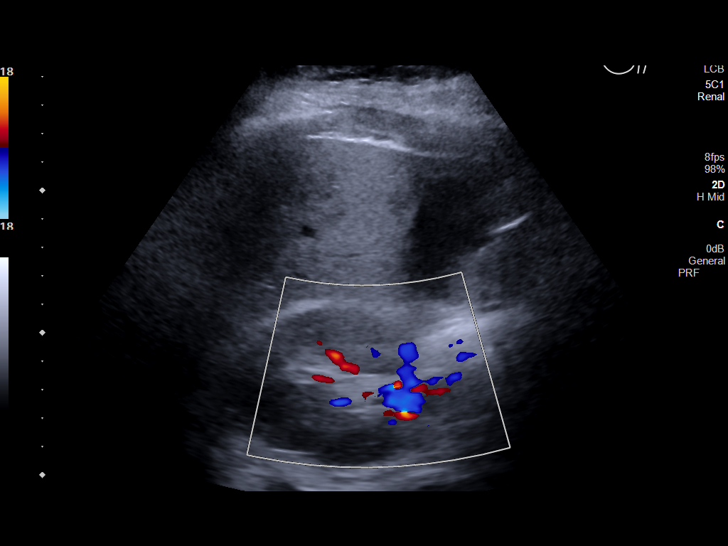
[im 14/31]
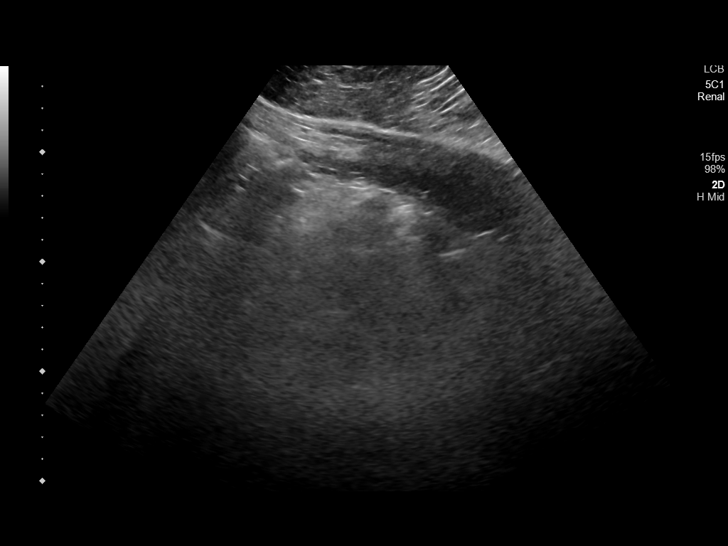
[im 17/31]
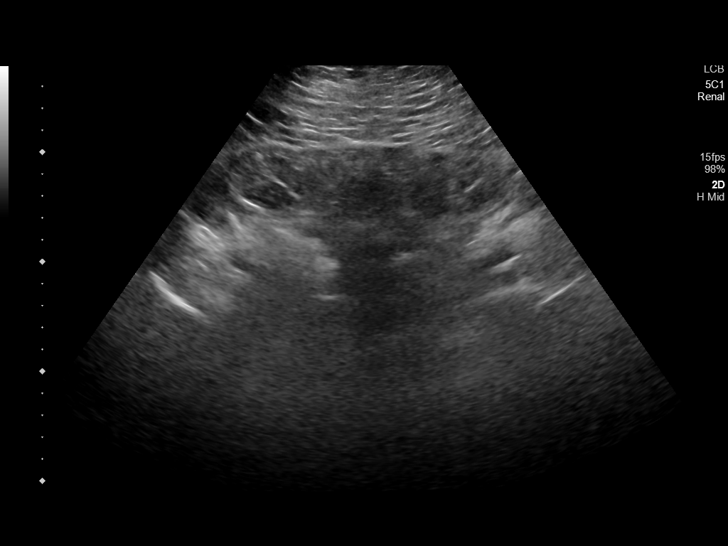
[im 19/31]
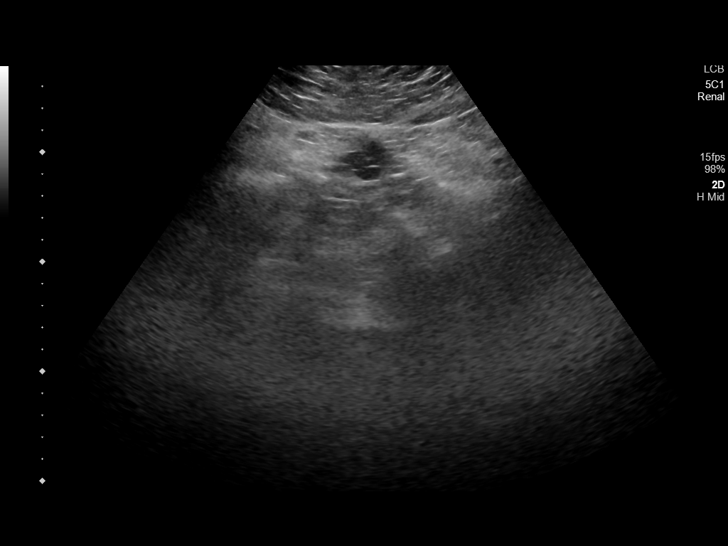
[im 21/31]
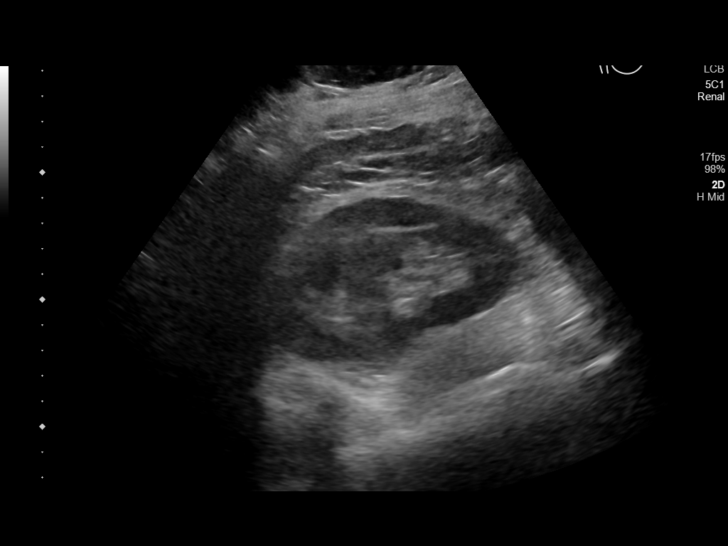
[im 23/31]
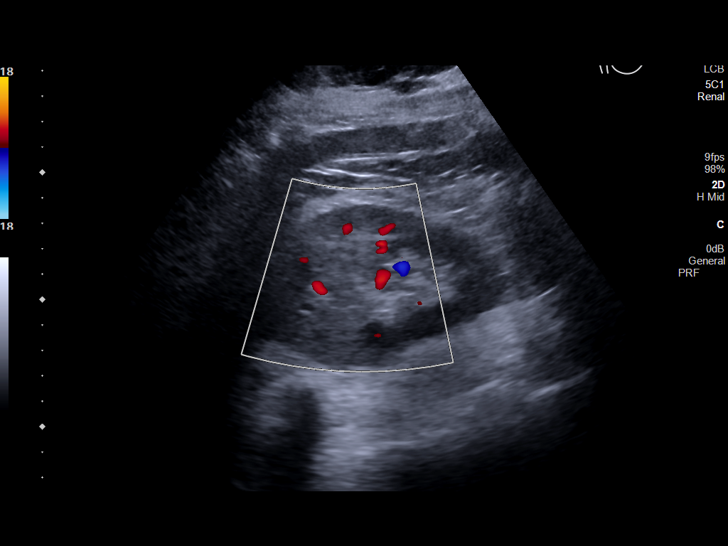
[im 26/31]
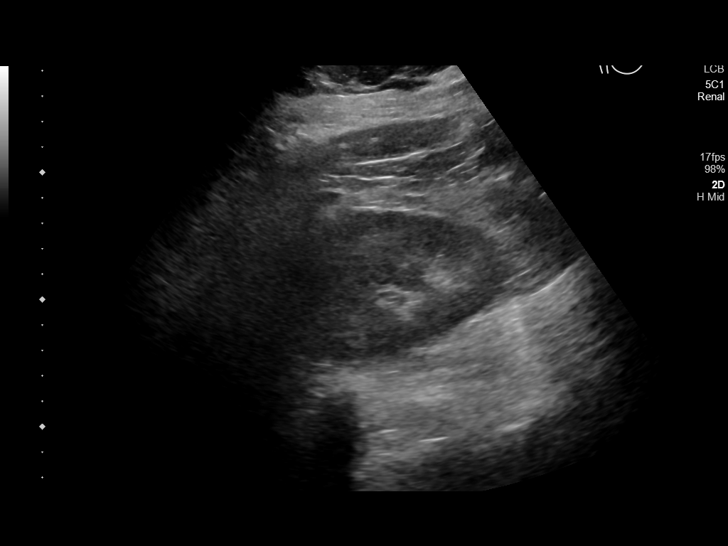
[im 28/31]
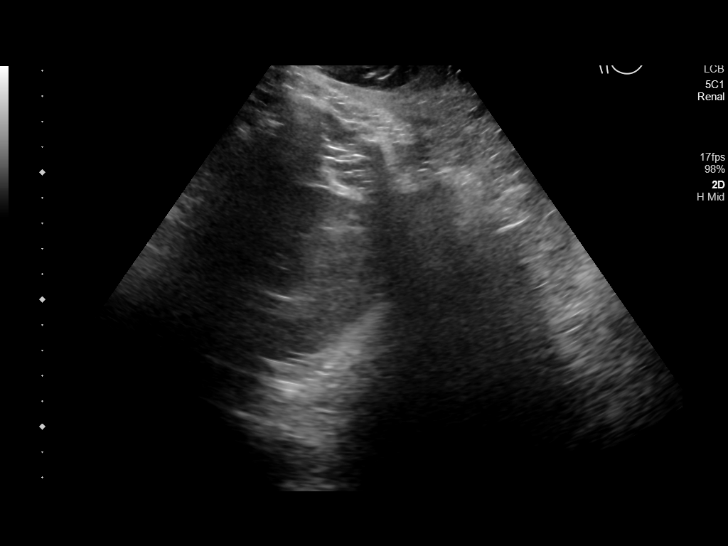
[im 31/31]
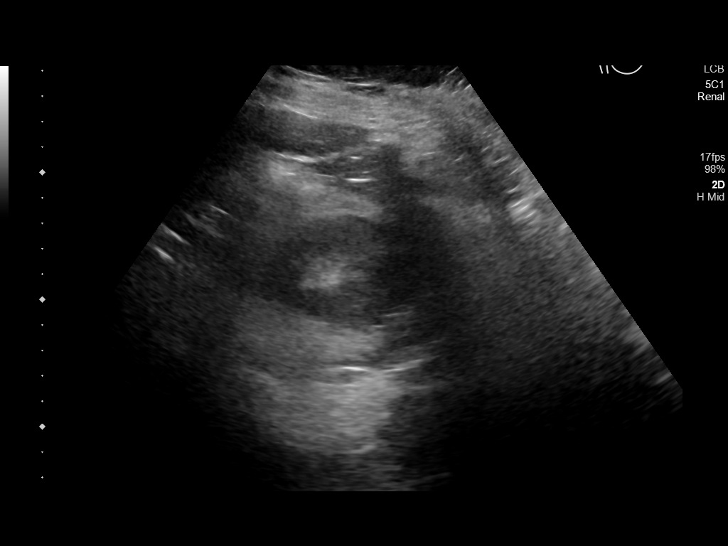

[14 of 25 positions shown; findings below may reference images not displayed]

FINDINGS: Right Kidney:

Renal measurements: 11.8 x 5.7 x 5.3 cm = volume: 187 mL.
Echogenicity and renal cortical thickness within normal limits. No
mass, perinephric fluid, or hydronephrosis visualized. No
sonographically demonstrable calculus or ureterectasis

Left Kidney:

Renal measurements: 10.4 x 6.4 x 6.8 cm = volume: 237 mL.
Echogenicity and renal cortical thickness within normal limits. No
mass, perinephric fluid, or hydronephrosis visualized. No
sonographically demonstrable calculus or ureterectasis.

Bladder:

Empty and cannot be assessed.

Other:

None.
IMPRESSION: Normal appearing kidneys bilaterally. Urinary bladder empty at this
time.
